# Patient Record
Sex: Male | Born: 1945 | Race: White | Marital: Married | Smoking: Current every day smoker
Health system: Southern US, Community
[De-identification: ages and names within clinical notes are randomized; demographics above are authoritative.]

## PROBLEM LIST (undated history)

## (undated) DIAGNOSIS — E119 Type 2 diabetes mellitus without complications: Secondary | ICD-10-CM

## (undated) DIAGNOSIS — I10 Essential (primary) hypertension: Secondary | ICD-10-CM

## (undated) HISTORY — PX: FOOT SURGERY: SHX648

## (undated) HISTORY — PX: APPENDECTOMY: SHX54

---

## 2017-10-07 ENCOUNTER — Other Ambulatory Visit: Payer: Self-pay

## 2017-10-07 ENCOUNTER — Observation Stay
Admission: EM | Admit: 2017-10-07 | Discharge: 2017-10-08 | Disposition: A | Discharge status: Home Health | Payer: Medicare Other | Attending: Internal Medicine | Admitting: Internal Medicine

## 2017-10-07 ENCOUNTER — Emergency Department: Payer: Medicare Other

## 2017-10-07 ENCOUNTER — Observation Stay: Payer: Medicare Other

## 2017-10-07 ENCOUNTER — Encounter: Payer: Self-pay | Admitting: Emergency Medicine

## 2017-10-07 ENCOUNTER — Observation Stay
Admit: 2017-10-07 | Discharge: 2017-10-07 | Disposition: A | Discharge status: Home / Self Care | Payer: Medicare Other | Attending: Internal Medicine | Admitting: Internal Medicine

## 2017-10-07 DIAGNOSIS — I129 Hypertensive chronic kidney disease with stage 1 through stage 4 chronic kidney disease, or unspecified chronic kidney disease: Secondary | ICD-10-CM | POA: Diagnosis not present

## 2017-10-07 DIAGNOSIS — R2681 Unsteadiness on feet: Secondary | ICD-10-CM | POA: Insufficient documentation

## 2017-10-07 DIAGNOSIS — E785 Hyperlipidemia, unspecified: Secondary | ICD-10-CM | POA: Diagnosis not present

## 2017-10-07 DIAGNOSIS — Z9114 Patient's other noncompliance with medication regimen: Secondary | ICD-10-CM | POA: Diagnosis not present

## 2017-10-07 DIAGNOSIS — E1165 Type 2 diabetes mellitus with hyperglycemia: Secondary | ICD-10-CM | POA: Diagnosis not present

## 2017-10-07 DIAGNOSIS — N19 Unspecified kidney failure: Secondary | ICD-10-CM

## 2017-10-07 DIAGNOSIS — E119 Type 2 diabetes mellitus without complications: Secondary | ICD-10-CM

## 2017-10-07 DIAGNOSIS — F17200 Nicotine dependence, unspecified, uncomplicated: Secondary | ICD-10-CM | POA: Insufficient documentation

## 2017-10-07 DIAGNOSIS — I6501 Occlusion and stenosis of right vertebral artery: Secondary | ICD-10-CM | POA: Insufficient documentation

## 2017-10-07 DIAGNOSIS — E1122 Type 2 diabetes mellitus with diabetic chronic kidney disease: Secondary | ICD-10-CM | POA: Insufficient documentation

## 2017-10-07 DIAGNOSIS — I639 Cerebral infarction, unspecified: Secondary | ICD-10-CM | POA: Diagnosis not present

## 2017-10-07 DIAGNOSIS — R739 Hyperglycemia, unspecified: Secondary | ICD-10-CM

## 2017-10-07 DIAGNOSIS — I10 Essential (primary) hypertension: Secondary | ICD-10-CM

## 2017-10-07 DIAGNOSIS — R4182 Altered mental status, unspecified: Secondary | ICD-10-CM | POA: Diagnosis present

## 2017-10-07 DIAGNOSIS — N183 Chronic kidney disease, stage 3 unspecified: Secondary | ICD-10-CM

## 2017-10-07 HISTORY — DX: Essential (primary) hypertension: I10

## 2017-10-07 HISTORY — DX: Type 2 diabetes mellitus without complications: E11.9

## 2017-10-07 LAB — COMPREHENSIVE METABOLIC PANEL
ALT: 13 U/L — ABNORMAL LOW (ref 17–63)
ANION GAP: 11 (ref 5–15)
AST: 14 U/L — ABNORMAL LOW (ref 15–41)
Albumin: 4 g/dL (ref 3.5–5.0)
Alkaline Phosphatase: 60 U/L (ref 38–126)
BUN: 37 mg/dL — ABNORMAL HIGH (ref 6–20)
CO2: 24 mmol/L (ref 22–32)
Calcium: 9.4 mg/dL (ref 8.9–10.3)
Chloride: 101 mmol/L (ref 101–111)
Creatinine, Ser: 1.42 mg/dL — ABNORMAL HIGH (ref 0.61–1.24)
GFR calc Af Amer: 56 mL/min — ABNORMAL LOW (ref >60)
GFR, EST NON AFRICAN AMERICAN: 49 mL/min — AB (ref >60)
Glucose, Bld: 305 mg/dL — ABNORMAL HIGH (ref 65–99)
POTASSIUM: 4.4 mmol/L (ref 3.5–5.1)
Sodium: 136 mmol/L (ref 135–145)
TOTAL PROTEIN: 7.5 g/dL (ref 6.5–8.1)
Total Bilirubin: 1.2 mg/dL (ref 0.3–1.2)

## 2017-10-07 LAB — URINALYSIS, COMPLETE (UACMP) WITH MICROSCOPIC
BILIRUBIN URINE: NEGATIVE
Bacteria, UA: NONE SEEN
GLUCOSE, UA: 150 mg/dL — AB
Ketones, ur: NEGATIVE mg/dL
LEUKOCYTES UA: NEGATIVE
Nitrite: NEGATIVE
PH: 5 (ref 5.0–8.0)
Protein, ur: 100 mg/dL — AB
SPECIFIC GRAVITY, URINE: 1.009 (ref 1.005–1.030)

## 2017-10-07 LAB — GLUCOSE, CAPILLARY
GLUCOSE-CAPILLARY: 298 mg/dL — AB (ref 65–99)
Glucose-Capillary: 316 mg/dL — ABNORMAL HIGH (ref 65–99)
Glucose-Capillary: 173 mg/dL — ABNORMAL HIGH (ref 65–99)

## 2017-10-07 LAB — ECHOCARDIOGRAM COMPLETE
HEIGHTINCHES: 73 in
WEIGHTICAEL: 2880 [oz_av]

## 2017-10-07 LAB — CBC WITH DIFFERENTIAL/PLATELET
BASOS ABS: 0.1 10*3/uL (ref 0–0.1)
Basophils Relative: 1 %
EOS PCT: 3 %
Eosinophils Absolute: 0.3 10*3/uL (ref 0–0.7)
HCT: 46.7 % (ref 40.0–52.0)
Hemoglobin: 15.7 g/dL (ref 13.0–18.0)
LYMPHS PCT: 26 %
Lymphs Abs: 2.4 10*3/uL (ref 1.0–3.6)
MCH: 30.2 pg (ref 26.0–34.0)
MCHC: 33.6 g/dL (ref 32.0–36.0)
MCV: 89.8 fL (ref 80.0–100.0)
Monocytes Absolute: 0.5 10*3/uL (ref 0.2–1.0)
Monocytes Relative: 6 %
Neutro Abs: 5.8 10*3/uL (ref 1.4–6.5)
Neutrophils Relative %: 64 %
PLATELETS: 128 10*3/uL — AB (ref 150–440)
RBC: 5.2 MIL/uL (ref 4.40–5.90)
RDW: 14 % (ref 11.5–14.5)
WBC: 9 10*3/uL (ref 3.8–10.6)

## 2017-10-07 LAB — URINE DRUG SCREEN, QUALITATIVE (ARMC ONLY)
Amphetamines, Ur Screen: NOT DETECTED
BARBITURATES, UR SCREEN: NOT DETECTED
Benzodiazepine, Ur Scrn: NOT DETECTED
CANNABINOID 50 NG, UR ~~LOC~~: NOT DETECTED
Cocaine Metabolite,Ur ~~LOC~~: NOT DETECTED
MDMA (Ecstasy)Ur Screen: NOT DETECTED
Methadone Scn, Ur: NOT DETECTED
OPIATE, UR SCREEN: NOT DETECTED
PHENCYCLIDINE (PCP) UR S: NOT DETECTED
Tricyclic, Ur Screen: NOT DETECTED

## 2017-10-07 LAB — LIPID PANEL
Cholesterol: 228 mg/dL — ABNORMAL HIGH (ref 0–200)
HDL: 33 mg/dL — ABNORMAL LOW (ref >40)
LDL CALC: 145 mg/dL — AB (ref 0–99)
Total CHOL/HDL Ratio: 6.9 RATIO
Triglycerides: 248 mg/dL — ABNORMAL HIGH (ref <150)
VLDL: 50 mg/dL — ABNORMAL HIGH (ref 0–40)

## 2017-10-07 LAB — TROPONIN I: TROPONIN I: 0.03 ng/mL — AB (ref <0.03)

## 2017-10-07 MED ORDER — CLOPIDOGREL BISULFATE 75 MG PO TABS
75.0000 mg | ORAL_TABLET | Freq: Every day | ORAL | Status: DC
  Administered 2017-10-07 – 2017-10-08 (×2): 75 mg via ORAL
  Filled 2017-10-07 (×2): qty 1

## 2017-10-07 MED ORDER — METFORMIN HCL 500 MG PO TABS
500.0000 mg | ORAL_TABLET | Freq: Two times a day (BID) | ORAL | Status: DC
  Administered 2017-10-07: 18:00:00 500 mg via ORAL
  Filled 2017-10-07: qty 1

## 2017-10-07 MED ORDER — SODIUM CHLORIDE 0.9 % IV SOLN
INTRAVENOUS | Status: DC
  Administered 2017-10-07 – 2017-10-08 (×2): via INTRAVENOUS

## 2017-10-07 MED ORDER — IPRATROPIUM-ALBUTEROL 0.5-2.5 (3) MG/3ML IN SOLN
3.0000 mL | Freq: Four times a day (QID) | RESPIRATORY_TRACT | Status: DC
Start: 2017-10-07 — End: 2017-10-08
  Administered 2017-10-07: 16:00:00 3 mL via RESPIRATORY_TRACT
  Filled 2017-10-07 (×3): qty 3

## 2017-10-07 MED ORDER — AMLODIPINE BESYLATE 5 MG PO TABS
5.0000 mg | ORAL_TABLET | Freq: Every day | ORAL | Status: DC
  Administered 2017-10-07 – 2017-10-08 (×2): 5 mg via ORAL
  Filled 2017-10-07 (×2): qty 1

## 2017-10-07 MED ORDER — DOCUSATE SODIUM 100 MG PO CAPS
100.0000 mg | ORAL_CAPSULE | Freq: Two times a day (BID) | ORAL | Status: DC
  Filled 2017-10-07 (×2): qty 1

## 2017-10-07 MED ORDER — INSULIN GLARGINE 100 UNIT/ML ~~LOC~~ SOLN
10.0000 [IU] | Freq: Every day | SUBCUTANEOUS | Status: DC
  Administered 2017-10-08: 10 [IU] via SUBCUTANEOUS
  Filled 2017-10-07 (×3): qty 0.1

## 2017-10-07 MED ORDER — ACETAMINOPHEN 650 MG RE SUPP: 650.0000 mg | Freq: Four times a day (QID) | RECTAL | Status: DC | PRN

## 2017-10-07 MED ORDER — HEPARIN SODIUM (PORCINE) 5000 UNIT/ML IJ SOLN: 5000.0000 [IU] | Freq: Three times a day (TID) | INTRAMUSCULAR | Status: DC

## 2017-10-07 MED ORDER — LORAZEPAM 2 MG/ML IJ SOLN: 1.0000 mg | Freq: Once | INTRAMUSCULAR | Status: DC

## 2017-10-07 MED ORDER — PANTOPRAZOLE SODIUM 40 MG IV SOLR
40.0000 mg | Freq: Two times a day (BID) | INTRAVENOUS | Status: DC
  Filled 2017-10-07 (×3): qty 40

## 2017-10-07 MED ORDER — LORAZEPAM 2 MG/ML IJ SOLN
0.5000 mg | INTRAMUSCULAR | Status: DC | PRN
  Administered 2017-10-07: 0.5 mg via INTRAVENOUS
  Filled 2017-10-07: qty 1

## 2017-10-07 MED ORDER — ACETAMINOPHEN 325 MG PO TABS: 650.0000 mg | ORAL_TABLET | Freq: Four times a day (QID) | ORAL | Status: DC | PRN

## 2017-10-07 MED ORDER — ATORVASTATIN CALCIUM 20 MG PO TABS
10.0000 mg | ORAL_TABLET | Freq: Every day | ORAL | Status: DC
  Administered 2017-10-07: 10 mg via ORAL
  Filled 2017-10-07: qty 1

## 2017-10-07 MED ORDER — NICOTINE 21 MG/24HR TD PT24
21.0000 mg | MEDICATED_PATCH | Freq: Once | TRANSDERMAL | Status: AC
  Administered 2017-10-07: 21 mg via TRANSDERMAL
  Filled 2017-10-07: qty 1

## 2017-10-07 MED ORDER — BISACODYL 10 MG RE SUPP: 10.0000 mg | Freq: Every day | RECTAL | Status: DC | PRN

## 2017-10-07 MED ORDER — INSULIN ASPART 100 UNIT/ML ~~LOC~~ SOLN
0.0000 [IU] | Freq: Three times a day (TID) | SUBCUTANEOUS | Status: DC
  Administered 2017-10-07: 18:00:00 7 [IU] via SUBCUTANEOUS
  Administered 2017-10-08: 10:00:00 5 [IU] via SUBCUTANEOUS
  Filled 2017-10-07 (×2): qty 1

## 2017-10-07 MED ORDER — ONDANSETRON HCL 4 MG/2ML IJ SOLN: 4.0000 mg | Freq: Four times a day (QID) | INTRAMUSCULAR | Status: DC | PRN

## 2017-10-07 MED ORDER — ONDANSETRON HCL 4 MG PO TABS: 4.0000 mg | ORAL_TABLET | Freq: Four times a day (QID) | ORAL | Status: DC | PRN

## 2017-10-07 NOTE — ED Notes (Signed)
Pt aggitated and wanting to leave, pulled off monitor leads and bp cuff. Pt trying to pull out iv. Pt medicated with ativan. Wife at bedside and sw Claudine in with pt.

## 2017-10-07 NOTE — Plan of Care (Signed)
  Education: Knowledge of disease or condition will improve 10/07/2017 1840 - Progressing by Donnel SaxonKennedy, Lisbeth Puller L, RN Knowledge of secondary prevention will improve 10/07/2017 1840 - Progressing by Donnel SaxonKennedy, Arley Salamone L, RN Knowledge of patient specific risk factors addressed and post discharge goals established will improve 10/07/2017 1840 - Progressing by Donnel SaxonKennedy, Arelene Moroni L, RN   Coping: Will identify appropriate support needs 10/07/2017 1840 - Progressing by Donnel SaxonKennedy, Breven Guidroz L, RN   Health Behavior/Discharge Planning: Ability to manage health-related needs will improve 10/07/2017 1840 - Progressing by Donnel SaxonKennedy, Lilyrose Tanney L, RN   Self-Care: Ability to participate in self-care as condition permits will improve 10/07/2017 1840 - Progressing by Donnel SaxonKennedy, Herchel Hopkin L, RN Ability to communicate needs accurately will improve 10/07/2017 1840 - Progressing by Donnel SaxonKennedy, Wilmot Quevedo L, RN   Ischemic Stroke/TIA Tissue Perfusion: Complications of ischemic stroke/TIA will be minimized 10/07/2017 1840 - Progressing by Donnel SaxonKennedy, Timoth Schara L, RN   Education: Knowledge of General Education information will improve 10/07/2017 1840 - Progressing by Donnel SaxonKennedy, Ashaunti Treptow L, RN   Health Behavior/Discharge Planning: Ability to manage health-related needs will improve 10/07/2017 1840 - Progressing by Donnel SaxonKennedy, Taleah Bellantoni L, RN   Clinical Measurements: Ability to maintain clinical measurements within normal limits will improve 10/07/2017 1840 - Progressing by Donnel SaxonKennedy, Jayne Peckenpaugh L, RN Will remain free from infection 10/07/2017 1840 - Progressing by Donnel SaxonKennedy, Marti Mclane L, RN Diagnostic test results will improve 10/07/2017 1840 - Progressing by Donnel SaxonKennedy, Kayleeann Huxford L, RN Respiratory complications will improve 10/07/2017 1840 - Progressing by Donnel SaxonKennedy, Alani Lacivita L, RN Cardiovascular complication will be avoided 10/07/2017 1840 - Progressing by Donnel SaxonKennedy, Mohogany Toppins L, RN   Activity: Risk for activity intolerance will decrease 10/07/2017  1840 - Progressing by Donnel SaxonKennedy, Bradlee Heitman L, RN   Nutrition: Adequate nutrition will be maintained 10/07/2017 1840 - Progressing by Donnel SaxonKennedy, Edwar Coe L, RN   Coping: Level of anxiety will decrease 10/07/2017 1840 - Progressing by Donnel SaxonKennedy, Reighlyn Elmes L, RN   Coping: Level of anxiety will decrease 10/07/2017 1840 - Progressing by Donnel SaxonKennedy, Janye Maynor L, RN   Elimination: Will not experience complications related to bowel motility 10/07/2017 1840 - Progressing by Donnel SaxonKennedy, Parag Dorton L, RN Will not experience complications related to urinary retention 10/07/2017 1840 - Progressing by Donnel SaxonKennedy, Jone Panebianco L, RN   Safety: Ability to remain free from injury will improve 10/07/2017 1840 - Progressing by Donnel SaxonKennedy, Nnamdi Dacus L, RN   Skin Integrity: Risk for impaired skin integrity will decrease 10/07/2017 1840 - Progressing by Donnel SaxonKennedy, Kathrynne Kulinski L, RN   Skin Integrity: Risk for impaired skin integrity will decrease 10/07/2017 1840 - Progressing by Donnel SaxonKennedy, Da Michelle L, RN

## 2017-10-07 NOTE — ED Triage Notes (Signed)
Here for elevated blood sugars max of 333 over last week. Has not taken his DM or HTN meds in about 3 years per wife because doesn't like doctor. Pt unable to answer birth date, location, time. Pt will sometimes just state "I don't know" and sometimes just look at you. Wife reports this has been for 1 week.

## 2017-10-07 NOTE — ED Notes (Addendum)
Date and time results received: 10/07/17 1009  Test: Troponin Critical Value: 0.03  Name of Provider Notified: Dr. Shaune PollackLord  Orders Received? Or Actions Taken?: No new orders.  Will continue to monitor.

## 2017-10-07 NOTE — Clinical Social Work Note (Signed)
Clinical Social Work Assessment  Patient Details  Name: Joseph RipaJohn Little MRN: [id number] Date of Birth: 1946-02-20  Date of referral:  10/07/17               Reason for consult:  Family Concerns, Discharge Planning                Permission sought to share information with:  Family Supports Permission granted to share information::  Yes, Verbal Permission Granted  Name::     Joseph Little ( wife) [phone] Sister Joseph Little  Agency::     Relationship::     Contact Information:     Housing/Transportation Living arrangements for the past 2 months:  Single Family Home Source of Information:  Patient, Siblings, Spouse Patient Interpreter Needed:  None Criminal Activity/Legal Involvement Pertinent to Current Situation/Hospitalization:  No - Comment as needed Significant Relationships:  Adult Children, Siblings, Spouse Lives with:  Spouse Do you feel safe going back to the place where you live?  Yes Need for family participation in patient care:  Yes (Comment)  Care giving concerns: He is not himself   Office managerocial Worker assessment / plan: LCSW introduced myself to patient his spouse  Joseph Little and his elder sister Velma SmithJohn Lauris Little is a 72 y.o. male has a past medical history significant for HTN and DM and tobacco use with no MD now with acute confusion. In ER, CT positive for CVA. Sugar and BP elevated.   Verbal consent was provided to speak in front of family and patient agreed his wife and sister could help answer questions. Patient and wife have been married 49 years and have 3 grown children. He is a retired Naval architecttruck driver and at home up until a week ago was very independent with all his activities of daily living. He is able to answer simple questions but his speech is impeded according to his wife. 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 He is diabetic. He is able to walk on his own but has been weak last few days and according to wife and sister he is different (  altered mental status and agitated). LCSW reviewed family self care- rest and support for his immediate care givers. In discussion wife reports once patient discharged, he is to return home and that they do not want him to go to SNF. They will remain open depending on Doctors recommendation and will seek out medical floor SW if their needs have changed. Patient has excellent support in home and wife feels that he would do better in his own home. 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  Emotional Assessment Appearance:  Appears stated age Attitude/Demeanor/Rapport:  Guarded, Apprehensive Affect (typically observed):  Appropriate, Calm Orientation:  Oriented to Self, Fluctuating Orientation (Suspected and/or reported Sundowners) Alcohol / Substance use:  Tobacco Use Psych involvement (Current and /or in the community):  No (Comment)  Discharge Needs  Concerns to be addressed:  No discharge needs identified Readmission within the last 30 days:  No Current discharge risk:  None Barriers to  Discharge:  No Barriers Identified   Cheron Schaumann, LCSW 10/07/2017, 1:36 PM

## 2017-10-07 NOTE — ED Provider Notes (Signed)
[facility] Emergency Department Provider Note ____________________________________________   I have reviewed the triage vital signs and the triage nursing note.  HISTORY  Chief Complaint Altered Mental Status   Historian Level 5 Caveat History Limited by patient poor story History per sister as well as wife who lives with him  HPI Joseph Little is a 72 y.o. male brought in by wife and sister for altered mental status worse over the last 2-4 days.  They state that he does not seem to be responding to them very well.  It seems like he takes a well to understand what they are saying, and have trouble getting his words out.  He has not seen a primary doctor in several years for which she was previously treated for diabetes and hypertension, is not on medication for that.  Sound like in the past he was probably also on aspirin.  Patient denies any headache.  Denies any focal weakness or numbness.  He does state that it seems that he has trouble finding his words.  Symptoms are moderate.  They actually presented to [facility] clinic due to elevated blood sugars and confusion and he was sent to the ED for further evaluation.  Denies history of known CVA, TIA, or heart attack.  It sounds like at times he had some episodes which were short-lived over even the past several months, but nothing like the last 2-4 days.   Past Medical History:  Diagnosis Date  . Diabetes mellitus without complication (HCC)   . Hypertension     There are no active problems to display for this patient.   Past Surgical History:  Procedure Laterality Date  . APPENDECTOMY    . FOOT SURGERY      Prior to Admission medications   Not on File    No Known Allergies  History reviewed. No pertinent family history.  Social History Social History   Tobacco Use  . Smoking status: Current Every Day Smoker  . Smokeless tobacco: Never Used  Substance Use Topics  . Alcohol use: No     Frequency: Never  . Drug use: No    Review of Systems  Constitutional: Negative for recent illness. Eyes: Negative for visual changes. ENT: Negative for sore throat. Cardiovascular: Negative for chest pain. Respiratory: Negative for shortness of breath. Gastrointestinal: Negative for abdominal pain, vomiting and diarrhea. Genitourinary: Negative for dysuria. Musculoskeletal: Negative for back pain. Skin: Negative for rash. Neurological: Negative for headache.  ____________________________________________   PHYSICAL EXAM:  VITAL SIGNS: ED Triage Vitals [10/07/17 0924]  Enc Vitals Group     BP      Pulse      Resp      Temp      Temp src      SpO2      Weight 180 lb (81.6 kg)     Height 6\' 1"  (1.854 m)     Head Circumference      Peak Flow      Pain Score      Pain Loc      Pain Edu?      Excl. in GC?      Constitutional: Alert and cooperative, poor historian. Well appearing and in no distress. HEENT   Head: Normocephalic and atraumatic.      Eyes: Conjunctivae are normal. Pupils equal and round.       Ears:         Nose: No congestion/rhinnorhea.   Mouth/Throat: Mucous membranes are moist.  Neck: No stridor. Cardiovascular/Chest: Normal rate, regular rhythm.  No murmurs, rubs, or gallops. Respiratory: Normal respiratory effort without tachypnea nor retractions. Breath sounds are clear and equal bilaterally. No wheezes/rales/rhonchi. Gastrointestinal: Soft. No distention, no guarding, no rebound. Nontender.    Genitourinary/rectal:Deferred Musculoskeletal: Nontender with normal range of motion in all extremities. No joint effusions.  No lower extremity tenderness.  No edema. Neurologic: No facial droop.  No slurred speech, but slow to answer questions. No gross or focal weakness or numbness in the arms of the legs or the face. Skin:  Skin is warm, dry and intact. No rash noted. Psychiatric: No  agitation.   ____________________________________________  LABS (pertinent positives/negatives) I, Governor Rooksebecca Salim Forero, MD the attending physician have reviewed the labs noted below.  Labs Reviewed  GLUCOSE, CAPILLARY - Abnormal; Notable for the following components:      Result Value   Glucose-Capillary 298 (*)    All other components within normal limits  URINALYSIS, COMPLETE (UACMP) WITH MICROSCOPIC - Abnormal; Notable for the following components:   Color, Urine YELLOW (*)    APPearance CLEAR (*)    Glucose, UA 150 (*)    Hgb urine dipstick MODERATE (*)    Protein, ur 100 (*)    Squamous Epithelial / LPF 0-5 (*)    All other components within normal limits  COMPREHENSIVE METABOLIC PANEL - Abnormal; Notable for the following components:   Glucose, Bld 305 (*)    BUN 37 (*)    Creatinine, Ser 1.42 (*)    AST 14 (*)    ALT 13 (*)    GFR calc non Af Amer 49 (*)    GFR calc Af Amer 56 (*)    All other components within normal limits  CBC WITH DIFFERENTIAL/PLATELET - Abnormal; Notable for the following components:   Platelets 128 (*)    All other components within normal limits  TROPONIN I - Abnormal; Notable for the following components:   Troponin I 0.03 (*)    All other components within normal limits  URINE DRUG SCREEN, QUALITATIVE ([facility] ONLY)    ____________________________________________    EKG I, Governor Rooksebecca Cable Fearn, MD, the attending physician have personally viewed and interpreted all ECGs.  93 bpm.  normal sinus rhythm.  Narrow QRS.  Nonspecific ST and T wave.  Occasional PVC ____________________________________________  RADIOLOGY   TM without contrast radiologist report, also discussed with the radiologist:IMPRESSION: 1. Scattered acute versus subacute appearing infarcts in the left hemisphere, largest at the left posterior MCA/PCA watershed area. There is an associated age indeterminate lacune in the left thalamus. Possible petechial hemorrhage, but no  malignant hemorrhagic transformation or mass effect. 2. 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 Tiny chronic left cerebellar lacune. Small chronic appearing lacunar infarcts in the left basal ganglia. __________________________________________  PROCEDURES  Procedure(s) performed: None  Critical Care performed: None   ____________________________________________  ED COURSE / ASSESSMENT AND PLAN  Pertinent labs & imaging results that were available during my care of the patient were reviewed by me and considered in my medical decision making (see chart for details).    Patient does seem somewhat slow to answer questions and they state this is definitely unusual for him given duration of episode and much worse than previously especially over the past 2 to possibly even out to 4 days.  I was called with a CT result from the radiologist indicating multiple acute to subacute strokes with one with a punctate hemorrhagic conversion without any mass-effect or anything.  Also on laboratory studies patient found to have acute renal failure although chronicity is unclear given that he does not have a prior history in our system.  He is also hyperglycemic, doubt DKA.  Spoke with hospitalist for admission.    DIFFERENTIAL DIAGNOSIS: Differential diagnosis includes, but is not limited to, alcohol, illicit or prescription medications, or other toxic ingestion; intracranial pathology such as stroke or intracerebral hemorrhage; fever or infectious causes including sepsis; hypoxemia and/or hypercarbia; uremia; trauma; endocrine related disorders such as diabetes, hypoglycemia, and thyroid-related diseases; hypertensive encephalopathy; etc.   CONSULTATIONS:  Hospitalist for admission.   Patient / Family / Caregiver informed of clinical course, medical decision-making process, and agree with plan.   ___________________________________________   FINAL CLINICAL  IMPRESSION(S) / ED DIAGNOSES   Final diagnoses:  Hyperglycemia  Renal failure, unspecified chronicity  Cerebrovascular accident (CVA), unspecified mechanism (HCC)      ___________________________________________        Note: This dictation was prepared with Dragon dictation. Any transcriptional errors that result from this process are unintentional    Governor Rooks, MD 10/07/17 1122

## 2017-10-07 NOTE — Progress Notes (Signed)
*  PRELIMINARY RESULTS* Echocardiogram 2D Echocardiogram has been performed.  Joseph Little 10/07/2017, 3:53 PM

## 2017-10-07 NOTE — H&P (Signed)
History and Physical    Joseph Little ZOX:[id number] DOB: 08-Sep-1945 DOA: 10/07/2017  Referring physician: Dr. Shaune Pollack PCP: Patient, No Pcp Per  Specialists: none  Chief Complaint: confusion  HPI: Joseph Little is a 72 y.o. male has a past medical history significant for HTN and DM and tobacco use with no MD now with acute confusion. In ER, CT positive for CVA. Sugar and BP elevated. He is now admitted. No fever. Denies CP or SOB. No change in bowels or bladder  Review of Systems: The patient denies anorexia, fever, weight loss,, vision loss, decreased hearing, hoarseness, chest pain, syncope, dyspnea on exertion, peripheral edema, balance deficits, hemoptysis, abdominal pain, melena, hematochezia, severe indigestion/heartburn, hematuria, incontinence, genital sores, muscle weakness, suspicious skin lesions, transient blindness, difficulty walking, depression, unusual weight change, abnormal bleeding, enlarged lymph nodes, angioedema, and breast masses.   Past Medical History:  Diagnosis Date  . Diabetes mellitus without complication (HCC)   . Hypertension    Past Surgical History:  Procedure Laterality Date  . APPENDECTOMY    . FOOT SURGERY     Social History:  reports that he has been smoking.  he has never used smokeless tobacco. He reports that he does not drink alcohol or use drugs.  No Known Allergies  History reviewed. No pertinent family history.  Prior to Admission medications   Not on File   Physical Exam: Vitals:   10/07/17 0924 10/07/17 0930 10/07/17 1000 10/07/17 1100  BP:  (!) 212/66 (!) 154/77 (!) 172/70  Pulse:  (!) 103 (!) 41 72  Resp:  20 14 17   Temp:  98.3 F (36.8 C)    TempSrc:  Oral    SpO2:  97% 95% 96%  Weight: 81.6 kg (180 lb)     Height: 6\' 1"  (1.854 m)        General:  No apparent distress, WDWN, [place]/AT  Eyes: PERRL, EOMI, no scleral icterus, conjunctiva clear  ENT: moist oropharynx without exudate, TM's benign, dentition fair  Neck: supple,  no lymphadenopathy. No bruits or thyromegaly  Cardiovascular: regular rate without MRG; 2+ peripheral pulses, no JVD, no peripheral edema  Respiratory: CTA biL, good air movement without wheezing, rhonchi or crackled. Respiratory effort normal  Abdomen: soft, non tender to palpation, positive bowel sounds, no guarding, no rebound  Skin: no rashes or lesions  Musculoskeletal: normal bulk and tone, no joint swelling  Psychiatric: normal mood and affect, A&OX3  Neurologic: CN 2-12 grossly intact, Motor strength 5/5 in all 4 groups with symmetric DTR's and non-focal sensory exam  Labs on Admission:  Basic Metabolic Panel: Recent Labs  Lab 10/07/17 0931  NA 136  K 4.4  CL 101  CO2 24  GLUCOSE 305*  BUN 37*  CREATININE 1.42*  CALCIUM 9.4   Liver Function Tests: Recent Labs  Lab 10/07/17 0931  AST 14*  ALT 13*  ALKPHOS 60  BILITOT 1.2  PROT 7.5  ALBUMIN 4.0   No results for input(s): LIPASE, AMYLASE in the last 168 hours. No results for input(s): AMMONIA in the last 168 hours. CBC: Recent Labs  Lab 10/07/17 0931  WBC 9.0  NEUTROABS 5.8  HGB 15.7  HCT 46.7  MCV 89.8  PLT 128*   Cardiac Enzymes: Recent Labs  Lab 10/07/17 0931  TROPONINI 0.03*    BNP (last 3 results) No results for input(s): BNP in the last 8760 hours.  ProBNP (last 3 results) No results for input(s): PROBNP in the last 8760 hours.  CBG: Recent  Labs  Lab 10/07/17 0928  GLUCAP 298*    Radiological Exams on Admission: Ct Head Wo Contrast  Addendum Date: 10/07/2017   ADDENDUM REPORT: 10/07/2017 10:42 ADDENDUM: Study discussed by telephone with Dr. Lurena JoinerEBECCA LORD on 10/07/2017 at 1034 hours. She advises that symptom onset was approximately 4 days ago, which seems concordant with the appearance of the left hemisphere infarcts. Electronically Signed   By: Odessa FlemingH  Hall M.D.   On: 10/07/2017 10:42   Result Date: 10/07/2017 CLINICAL DATA:  72 year old male with unexplained altered mental status. EXAM:  CT HEAD WITHOUT CONTRAST TECHNIQUE: Contiguous axial images were obtained from the base of the skull through the vertex without intravenous contrast. COMPARISON:  None. FINDINGS: Brain: Confluent 4 centimeter area of cytotoxic edema in the inferior left parietal and superior left occipital lobes. Possible petechial hemorrhage (series 2, image 15), but no associated mass effect. The left occipital horn is actually slightly larger than the right. There is also a small area of cortical hypodensity in the superior left parietal lobe on series 2, image 22. There is also a small area of cortical hypodensity in the left frontal operculum on image 18. 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 Small age indeterminate lacune in the anterior left thalamus (series 2, image 12). Patchy chronic appearing hypodensity in the right cerebellar hemisphere. The right aspect of the 4th ventricle is slightly larger also suggesting chronic time course. There is also a tiny chronic appearing lacune in the left superior cerebellum on image 9. Scattered bilateral cerebral white matter hypodensity, mostly subcortical. No other cortical based infarct identified. No other intracranial hemorrhage. No intracranial mass effect. No ventriculomegaly. No extra-axial fluid collection. Vascular: Calcified atherosclerosis at the skull base. No definite suspicious intracranial vascular hyperdensity. Skull: Negative. Sinuses/Orbits: Scattered paranasal sinus mucosal thickening and opacification. The tympanic cavities and mastoids appear clear. Other: No acute orbit or scalp soft tissue findings. IMPRESSION: 1. Scattered acute versus subacute appearing infarcts in the left hemisphere, largest at the left posterior MCA/PCA watershed area. There is an associated age indeterminate lacune in the left thalamus. Possible petechial hemorrhage, but no malignant hemorrhagic transformation or mass effect. 2. Age indeterminate but probably chronic  right cerebellar infarct. Tiny chronic left cerebellar lacune. Small chronic appearing lacunar infarcts in the left basal ganglia. Electronically Signed: By: Odessa FlemingH  Hall M.D. On: 10/07/2017 10:32    EKG: Independently reviewed.  Assessment/Plan Principal Problem:   CVA (cerebrovascular accident) (HCC) Active Problems:   HTN (hypertension)   CKD (chronic kidney disease) stage 3, GFR 30-59 ml/min (HCC)   DM (diabetes mellitus) (HCC)   Will observe on floor with telemetry. Consult Neurology. Echo and carotids ordered. Begin statin, metformin, and Plavix. Labs in AM  Diet: soft Fluids: NS@75  DVT Prophylaxis: SQ Heparin  Code Status: FULL  Family Communication: yes  Disposition Plan: home  Time spent: 50 min

## 2017-10-07 NOTE — ED Notes (Signed)
Patient transported to CT 

## 2017-10-08 ENCOUNTER — Observation Stay: Payer: Medicare Other

## 2017-10-08 DIAGNOSIS — I639 Cerebral infarction, unspecified: Secondary | ICD-10-CM | POA: Diagnosis not present

## 2017-10-08 LAB — COMPREHENSIVE METABOLIC PANEL
ALT: 11 U/L — AB (ref 17–63)
AST: 15 U/L (ref 15–41)
Albumin: 3.3 g/dL — ABNORMAL LOW (ref 3.5–5.0)
Alkaline Phosphatase: 50 U/L (ref 38–126)
Anion gap: 8 (ref 5–15)
BUN: 32 mg/dL — AB (ref 6–20)
CHLORIDE: 106 mmol/L (ref 101–111)
CO2: 24 mmol/L (ref 22–32)
CREATININE: 1.22 mg/dL (ref 0.61–1.24)
Calcium: 9.1 mg/dL (ref 8.9–10.3)
GFR calc Af Amer: >60 mL/min (ref >60)
GFR, EST NON AFRICAN AMERICAN: 58 mL/min — AB (ref >60)
Glucose, Bld: 223 mg/dL — ABNORMAL HIGH (ref 65–99)
Potassium: 4.2 mmol/L (ref 3.5–5.1)
Sodium: 138 mmol/L (ref 135–145)
TOTAL PROTEIN: 6 g/dL — AB (ref 6.5–8.1)
Total Bilirubin: 1 mg/dL (ref 0.3–1.2)

## 2017-10-08 LAB — CBC
HCT: 40.3 % (ref 40.0–52.0)
Hemoglobin: 13.9 g/dL (ref 13.0–18.0)
MCH: 30.7 pg (ref 26.0–34.0)
MCHC: 34.4 g/dL (ref 32.0–36.0)
MCV: 89.4 fL (ref 80.0–100.0)
PLATELETS: 106 10*3/uL — AB (ref 150–440)
RBC: 4.51 MIL/uL (ref 4.40–5.90)
RDW: 13.9 % (ref 11.5–14.5)
WBC: 8 10*3/uL (ref 3.8–10.6)

## 2017-10-08 LAB — GLUCOSE, CAPILLARY
GLUCOSE-CAPILLARY: 271 mg/dL — AB (ref 65–99)
Glucose-Capillary: 202 mg/dL — ABNORMAL HIGH (ref 65–99)

## 2017-10-08 LAB — HEMOGLOBIN A1C
Hgb A1c MFr Bld: 10.2 % — ABNORMAL HIGH (ref 4.8–5.6)
Mean Plasma Glucose: 246.04 mg/dL

## 2017-10-08 MED ORDER — IPRATROPIUM-ALBUTEROL 0.5-2.5 (3) MG/3ML IN SOLN: 3.0000 mL | RESPIRATORY_TRACT | Status: DC | PRN

## 2017-10-08 MED ORDER — GLIPIZIDE 5 MG PO TABS: 5.0000 mg | ORAL_TABLET | Freq: Two times a day (BID) | ORAL | 11 refills | Status: DC

## 2017-10-08 MED ORDER — ATORVASTATIN CALCIUM 20 MG PO TABS: 40.0000 mg | ORAL_TABLET | Freq: Every day | ORAL | Status: DC

## 2017-10-08 MED ORDER — ASPIRIN EC 325 MG PO TBEC: 325.0000 mg | DELAYED_RELEASE_TABLET | Freq: Every day | ORAL | 3 refills | Status: DC

## 2017-10-08 MED ORDER — NICOTINE 21 MG/24HR TD PT24: 21.0000 mg | MEDICATED_PATCH | Freq: Once | TRANSDERMAL | 0 refills | Status: AC

## 2017-10-08 MED ORDER — ASPIRIN 325 MG PO TABS
325.0000 mg | ORAL_TABLET | Freq: Every day | ORAL | Status: DC
  Filled 2017-10-08: qty 1

## 2017-10-08 MED ORDER — ATORVASTATIN CALCIUM 40 MG PO TABS: 40.0000 mg | ORAL_TABLET | Freq: Every day | ORAL | 0 refills | Status: DC

## 2017-10-08 MED ORDER — METFORMIN HCL 1000 MG PO TABS: 1000.0000 mg | ORAL_TABLET | Freq: Two times a day (BID) | ORAL | 0 refills | Status: AC

## 2017-10-08 MED ORDER — AMLODIPINE BESYLATE 10 MG PO TABS: 10.0000 mg | ORAL_TABLET | Freq: Every day | ORAL | 0 refills | Status: AC

## 2017-10-08 NOTE — Progress Notes (Signed)
Patient's wife refused scheduled medications including Lantus 10 units at bedtime. Wife told this RN not to bring Heparin injection scheduled this morning  patient is on Plavix. Also refused midnight neuro check assessment, indicated not to wake patient up when he's sleeping. Patient and wife were  educated. Printed materials provided for patient and wife especially on the importance of receiving  Heparin SQ. No acute distress noted. Will continue to monitor.

## 2017-10-08 NOTE — Consult Note (Signed)
Referring Physician: Dr. Juliene PinaMody    Chief Complaint: stroke  HPI: Joseph Little is an 72 y.o. male here for stroke.  Past medical history stroke, hypertension, chronic kidney disease, diabetes, smoking, medical noncompliance.  Patient's family is at bedside.  Had an extended visit with family discussing patient's current stroke which appears embolic as well as prior strokes.  Patient is noncompliant with medication and has not has not been managing his diabetes and other vascular risk factors he is a current every day smoker.  Had a long discussion that current stroke needs further evaluation possibly with TEE and/or loop to rule out the possibility of atrial fibrillation, also discussed he has significant atherosclerosis due to untreated vascular risk factors and current everyday smoking and he has prior bilateral stroke burden in the brain.  Highly encouraged him to stay for further workup patient declines he does not want further workup and he does not want to see neurology outpatient.  Past Medical History:  Diagnosis Date  . Diabetes mellitus without complication (HCC)   . Hypertension     Past Surgical History:  Procedure Laterality Date  . APPENDECTOMY    . FOOT SURGERY      History reviewed. No pertinent family history. Social History:  reports that he has been smoking.  he has never used smokeless tobacco. He reports that he does not drink alcohol or use drugs.  Allergies: No Known Allergies  Medications: reviewed  ROS: Declines answering  Physical Examination: Blood pressure (!) 156/63, pulse 82, temperature 97.7 F (36.5 C), temperature source Oral, resp. rate 20, height 6\' 1"  (1.854 m), weight 222 lb 3.6 oz (100.8 kg), SpO2 96 %.  Patient declines examination of any type  Laboratory Studies:  Basic Metabolic Panel: Recent Labs  Lab 10/07/17 0931 10/08/17 0508  NA 136 138  K 4.4 4.2  CL 101 106  CO2 24 24  GLUCOSE 305* 223*  BUN 37* 32*  CREATININE 1.42* 1.22   CALCIUM 9.4 9.1    Liver Function Tests: Recent Labs  Lab 10/07/17 0931 10/08/17 0508  AST 14* 15  ALT 13* 11*  ALKPHOS 60 50  BILITOT 1.2 1.0  PROT 7.5 6.0*  ALBUMIN 4.0 3.3*   No results for input(s): LIPASE, AMYLASE in the last 168 hours. No results for input(s): AMMONIA in the last 168 hours.  CBC: Recent Labs  Lab 10/07/17 0931 10/08/17 0508  WBC 9.0 8.0  NEUTROABS 5.8  --   HGB 15.7 13.9  HCT 46.7 40.3  MCV 89.8 89.4  PLT 128* 106*    Cardiac Enzymes: Recent Labs  Lab 10/07/17 0931  TROPONINI 0.03*    BNP: Invalid input(s): POCBNP  CBG: Recent Labs  Lab 10/07/17 0928 10/07/17 1659 10/07/17 2130 10/08/17 0825 10/08/17 1132  GLUCAP 298* 316* 173* 271* 202*    Microbiology: No results found for this or any previous visit.  Coagulation Studies: No results for input(s): LABPROT, INR in the last 72 hours.  Urinalysis:  Recent Labs  Lab 10/07/17 1040  COLORURINE YELLOW*  LABSPEC 1.009  PHURINE 5.0  GLUCOSEU 150*  HGBUR MODERATE*  BILIRUBINUR NEGATIVE  KETONESUR NEGATIVE  PROTEINUR 100*  NITRITE NEGATIVE  LEUKOCYTESUR NEGATIVE    Lipid Panel:    Component Value Date/Time   CHOL 228 (H) 10/07/2017 0931   TRIG 248 (H) 10/07/2017 0931   HDL 33 (L) 10/07/2017 0931   CHOLHDL 6.9 10/07/2017 0931   VLDL 50 (H) 10/07/2017 0931   LDLCALC 145 (H) 10/07/2017 [id number]  HgbA1C:  Lab Results  Component Value Date   HGBA1C 10.2 (H) 10/07/2017    Urine Drug Screen:      Component Value Date/Time   LABOPIA NONE DETECTED 10/07/2017 1040   COCAINSCRNUR NONE DETECTED 10/07/2017 1040   LABBENZ NONE DETECTED 10/07/2017 1040   AMPHETMU NONE DETECTED 10/07/2017 1040   THCU NONE DETECTED 10/07/2017 1040   LABBARB NONE DETECTED 10/07/2017 1040    Alcohol Level: No results for input(s): ETH in the last 168 hours.   Imaging: Ct Head Wo Contrast  Addendum Date: 10/07/2017   ADDENDUM REPORT: 10/07/2017 10:42 ADDENDUM: Study discussed by  telephone with Dr. Lurena Joiner LORD on 10/07/2017 at 1034 hours. She advises that symptom onset was approximately 4 days ago, which seems concordant with the appearance of the left hemisphere infarcts. Electronically Signed   By: Odessa Fleming M.D.   On: 10/07/2017 10:42   Result Date: 10/07/2017 CLINICAL DATA:  72 year old male with unexplained altered mental status. EXAM: CT HEAD WITHOUT CONTRAST TECHNIQUE: Contiguous axial images were obtained from the base of the skull through the vertex without intravenous contrast. COMPARISON:  None. FINDINGS: Brain: Confluent 4 centimeter area of cytotoxic edema in the inferior left parietal and superior left occipital lobes. Possible petechial hemorrhage (series 2, image 15), but no associated mass effect. The left occipital horn is actually slightly larger than the right. There is also a small area of cortical hypodensity in the superior left parietal lobe on series 2, image 22. There is also a small area of cortical hypodensity in the left frontal operculum on image 18. 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 Small age indeterminate lacune in the anterior left thalamus (series 2, image 12). Patchy chronic appearing hypodensity in the right cerebellar hemisphere. The right aspect of the 4th ventricle is slightly larger also suggesting chronic time course. There is also a tiny chronic appearing lacune in the left superior cerebellum on image 9. Scattered bilateral cerebral white matter hypodensity, mostly subcortical. No other cortical based infarct identified. No other intracranial hemorrhage. No intracranial mass effect. No ventriculomegaly. No extra-axial fluid collection. Vascular: Calcified atherosclerosis at the skull base. No definite suspicious intracranial vascular hyperdensity. Skull: Negative. Sinuses/Orbits: Scattered paranasal sinus mucosal thickening and opacification. The tympanic cavities and mastoids appear clear. Other: No acute orbit or scalp  soft tissue findings. IMPRESSION: 1. Scattered acute versus subacute appearing infarcts in the left hemisphere, largest at the left posterior MCA/PCA watershed area. There is an associated age indeterminate lacune in the left thalamus. Possible petechial hemorrhage, but no malignant hemorrhagic transformation or mass effect. 2. 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 Tiny chronic left cerebellar lacune. Small chronic appearing lacunar infarcts in the left basal ganglia. Electronically Signed: By: Odessa Fleming M.D. On: 10/07/2017 10:32   Mr Maxine Glenn Head Wo Contrast  Addendum Date: 10/08/2017   ADDENDUM REPORT: 10/08/2017 09:59 ADDENDUM: Study discussed by telephone with Dr. Patricia Pesa MODY on 10/08/2017 at 0946 hours. Electronically Signed   By: Odessa Fleming M.D.   On: 10/08/2017 09:59   Result Date: 10/08/2017 CLINICAL DATA:  71 year old male with recent unexplained altered mental status. Scattered acute and subacute appearing cerebral infarcts on head CT yesterday, most pronounced in the left MCA/PCA watershed area. EXAM: MRI HEAD WITHOUT CONTRAST MRA HEAD WITHOUT CONTRAST TECHNIQUE: Multiplanar, multiecho pulse sequences of the brain and surrounding structures were obtained without intravenous contrast. Angiographic images of the head were obtained using MRA technique without contrast. COMPARISON:  Carotid Doppler ultrasound  10/07/2017. Head CT 10/07/2017. FINDINGS: MRI HEAD FINDINGS Brain: Confluent restricted diffusion involving 4.5 centimeter area of the superolateral left occipital lobe and inferior left parietal lobe corresponding to the dominant cytotoxic edema on CT yesterday. Scattered additional small cortical and white matter foci of restricted diffusion, in both the left occipital lobe and the left parietal lobe. Associated cytotoxic edema. Possible trace petechial hemorrhage. No mass effect. Additional abnormal trace diffusion signal in the mid left corona radiata which appears isointense on  ADC (series 100, image 33). No other diffusion abnormality. There are patchy chronic infarcts in the right cerebellum, several small chronic infarcts in the left cerebellum. Small cortical infarcts in the right lateral occipital lobe, a small chronic cortical infarct in the left superior parietal lobe (mild hemosiderin) a chronic lacunar infarct of the left corona radiata and basal ganglia (mild hemosiderin), and small chronic lacunar infarcts in both thalami. Scattered additional bilateral cerebral white matter T2 and FLAIR hyperintensity also likely is small vessel disease related. No midline shift, mass effect, evidence of mass lesion, ventriculomegaly, extra-axial collection or acute intracranial hemorrhage. Cervicomedullary junction and pituitary are within normal limits. Vascular: Loss of the distal right vertebral artery flow void suspicious for occlusion. The distal left vertebral artery flow void is maintained. The other Major intracranial vascular flow voids are preserved. Skull and upper cervical spine: Normal visible cervical spine. Normal bone marrow signal. Sinuses/Orbits: Normal orbits soft tissues. Mild to moderate paranasal sinus mucosal thickening. Other: Visible internal auditory structures appear normal. Mastoid air cells are clear. Scalp and face soft tissues appear negative. MRA HEAD FINDINGS Absent flow in the distal right vertebral artery. The distal left vertebral artery is patent, as is the left PICA although the left PICA origin is not included. There is irregularity of the distal left vertebral artery and basilar artery without stenosis. Patent SCA origins. There are fetal type bilateral PCA origins, although small PCA P1 segments might be chronically thrombosed, uncertain. The left PCA is occluded in the P1 segment. There is severe stenosis in the right PCA P2 segment (series 106, image 15). Antegrade flow in both ICA siphons. Widespread siphon irregularity with no hemodynamically  significant stenosis. There does appear to be mild right siphon anterior genu stenosis. Normal ophthalmic and posterior communicating artery origins. Patent carotid termini. Normal left MCA origin. Mild left MCA M1 segment irregularity. Mild to moderate irregularity and stenosis at the left MCA bifurcation. There is a 6 centimeter segment of thrombosis or severe stenosis of a posterior left M2 branch (series 104, image 5). No other left MCA branch occlusion is identified. There is moderate to severe regularity throughout the right MCA M1 segment with up to moderate stenoses (series 104, image 7). The right MCA bifurcation is patent. There is a paucity of posterior right MCA M3 branches. There is moderate irregularity and stenosis of the left ACA A1 segment, although it may be non dominant. The right A1 is mildly irregular. The anterior communicating artery is patent. The left A2 origin is mildly to moderately irregular and stenotic. The distal ACA branches remain patent. IMPRESSION: 1. Acute to subacute 4.5 cm infarct in the posterior left hemisphere at the Left PCA/MCA watershed corresponding to the dominant CT finding. Edema with questionable petechial hemorrhage but no mass effect. 2. Occlusion of both the Left PCA, and a short segment occlusion of a Left MCA posterior M2 branch. 3. There is a small subacute appearing lacunar infarct in the left corona radiata, left MCA small vessel territory. 4.  Multiple other chronic cerebral infarcts, including in both cerebellar hemispheres, both thalami, the left basal ganglia, and in small areas of posterior left MCA and right PCA territory cortex. 5. Occlusion of the distal Right vertebral artery, appears chronic. Additional intracranial atherosclerosis with moderate or severe stenoses of the: - Right MCA. - Left ACA. - Right PCA. Electronically Signed: By: Odessa Fleming M.D. On: 10/08/2017 09:42   Mr Brain Wo Contrast  Addendum Date: 10/08/2017   ADDENDUM REPORT: 10/08/2017  09:59 ADDENDUM: Study discussed by telephone with Dr. Patricia Pesa MODY on 10/08/2017 at 0946 hours. Electronically Signed   By: Odessa Fleming M.D.   On: 10/08/2017 09:59   Result Date: 10/08/2017 CLINICAL DATA:  72 year old male with recent unexplained altered mental status. Scattered acute and subacute appearing cerebral infarcts on head CT yesterday, most pronounced in the left MCA/PCA watershed area. EXAM: MRI HEAD WITHOUT CONTRAST MRA HEAD WITHOUT CONTRAST TECHNIQUE: Multiplanar, multiecho pulse sequences of the brain and surrounding structures were obtained without intravenous contrast. Angiographic images of the head were obtained using MRA technique without contrast. COMPARISON:  Carotid Doppler ultrasound 10/07/2017. Head CT 10/07/2017. FINDINGS: MRI HEAD FINDINGS Brain: Confluent restricted diffusion involving 4.5 centimeter area of the superolateral left occipital lobe and inferior left parietal lobe corresponding to the dominant cytotoxic edema on CT yesterday. Scattered additional small cortical and white matter foci of restricted diffusion, in both the left occipital lobe and the left parietal lobe. Associated cytotoxic edema. Possible trace petechial hemorrhage. No mass effect. Additional abnormal trace diffusion signal in the mid left corona radiata which appears isointense on ADC (series 100, image 33). No other diffusion abnormality. There are patchy chronic infarcts in the right cerebellum, several small chronic infarcts in the left cerebellum. Small cortical infarcts in the right lateral occipital lobe, a small chronic cortical infarct in the left superior parietal lobe (mild hemosiderin) a chronic lacunar infarct of the left corona radiata and basal ganglia (mild hemosiderin), and small chronic lacunar infarcts in both thalami. Scattered additional bilateral cerebral white matter T2 and FLAIR hyperintensity also likely is small vessel disease related. No midline shift, mass effect, evidence of mass lesion,  ventriculomegaly, extra-axial collection or acute intracranial hemorrhage. Cervicomedullary junction and pituitary are within normal limits. Vascular: Loss of the distal right vertebral artery flow void suspicious for occlusion. The distal left vertebral artery flow void is maintained. The other Major intracranial vascular flow voids are preserved. Skull and upper cervical spine: Normal visible cervical spine. Normal bone marrow signal. Sinuses/Orbits: Normal orbits soft tissues. Mild to moderate paranasal sinus mucosal thickening. Other: Visible internal auditory structures appear normal. Mastoid air cells are clear. Scalp and face soft tissues appear negative. MRA HEAD FINDINGS Absent flow in the distal right vertebral artery. The distal left vertebral artery is patent, as is the left PICA although the left PICA origin is not included. There is irregularity of the distal left vertebral artery and basilar artery without stenosis. Patent SCA origins. There are fetal type bilateral PCA origins, although small PCA P1 segments might be chronically thrombosed, uncertain. The left PCA is occluded in the P1 segment. There is severe stenosis in the right PCA P2 segment (series 106, image 15). Antegrade flow in both ICA siphons. Widespread siphon irregularity with no hemodynamically significant stenosis. There does appear to be mild right siphon anterior genu stenosis. Normal ophthalmic and posterior communicating artery origins. Patent carotid termini. Normal left MCA origin. Mild left MCA M1 segment irregularity. Mild to moderate irregularity and stenosis at  the left MCA bifurcation. There is a 6 centimeter segment of thrombosis or severe stenosis of a posterior left M2 branch (series 104, image 5). No other left MCA branch occlusion is identified. There is moderate to severe regularity throughout the right MCA M1 segment with up to moderate stenoses (series 104, image 7). The right MCA bifurcation is patent. There is a  paucity of posterior right MCA M3 branches. There is moderate irregularity and stenosis of the left ACA A1 segment, although it may be non dominant. The right A1 is mildly irregular. The anterior communicating artery is patent. The left A2 origin is mildly to moderately irregular and stenotic. The distal ACA branches remain patent. IMPRESSION: 1. Acute to subacute 4.5 cm infarct in the posterior left hemisphere at the Left PCA/MCA watershed corresponding to the dominant CT finding. Edema with questionable petechial hemorrhage but no mass effect. 2. Occlusion of both the Left PCA, and a short segment occlusion of a Left MCA posterior M2 branch. 3. There is a small subacute appearing lacunar infarct in the left corona radiata, left MCA small vessel territory. 4. Multiple other chronic cerebral infarcts, including in both cerebellar hemispheres, both thalami, the left basal ganglia, and in small areas of posterior left MCA and right PCA territory cortex. 5. Occlusion of the distal Right vertebral artery, appears chronic. Additional intracranial atherosclerosis with moderate or severe stenoses of the: - Right MCA. - Left ACA. - Right PCA. Electronically Signed: By: Odessa Fleming M.D. On: 10/08/2017 09:42   US Carotid Bilateral  Result Date: 10/07/2017 CLINICAL DATA:  History of CVA. History of hypertension, hyperlipidemia and diabetes. EXAM: BILATERAL CAROTID DUPLEX ULTRASOUND TECHNIQUE: Wallace Cullens scale imaging, color Doppler and duplex ultrasound were performed of bilateral carotid and vertebral arteries in the neck. COMPARISON:  None. FINDINGS: Criteria: Quantification of carotid stenosis is based on velocity parameters that correlate the residual internal carotid diameter with NASCET-based stenosis levels, using the diameter of the distal internal carotid lumen as the denominator for stenosis measurement. The following velocity measurements were obtained: RIGHT ICA:  112/32 cm/sec CCA:  68/12 cm/sec SYSTOLIC ICA/CCA RATIO:   1.7 DIASTOLIC ICA/CCA RATIO:  2.6 ECA:  195 cm/sec LEFT ICA:  82/24 cm/sec CCA:  97/11 cm/sec SYSTOLIC ICA/CCA RATIO:  0.9 DIASTOLIC ICA/CCA RATIO:  2.2 ECA:  127 cm/sec RIGHT CAROTID ARTERY: Scattered minimal amount of mixed echogenic plaque seen throughout the right common carotid artery. There is a moderate to large amount of atherosclerotic plaque within the right carotid bulb (images 14 and 16), extending to involve the origin and proximal aspects of the right internal carotid artery (image 24), not resulting in elevated peak systolic velocities within the interrogated course the right internal carotid artery to suggest a hemodynamically significant stenosis. RIGHT VERTEBRAL ARTERY:  Antegrade flow LEFT CAROTID ARTERY: There is a moderate amount of atherosclerotic plaque involving the mid aspect the left common carotid artery (images 39 and 40). There is a minimal amount of atherosclerotic plaque within the left carotid bulb (images 47 and 49), extending to involve the origin and proximal aspect the left internal carotid artery (image 57), not resulting in elevated peak systolic velocities within the interrogated course the left internal carotid artery to suggest a hemodynamically significant stenosis. LEFT VERTEBRAL ARTERY:  Antegrade flow IMPRESSION: Moderate to large amount of bilateral atherosclerotic plaque, not resulting in a hemodynamically significant stenosis within either internal carotid artery. Electronically Signed   By: Simonne Come M.D.   On: 10/07/2017 15:48   Attempted exam,  patient declines   Assessment: 72 y.o. male  for stroke.  Past medical history stroke, hypertension, chronic kidney disease, diabetes, smoking, medical noncompliance.  Patient's family is at bedside  Stroke likely embolic, widespread atherosclerotic disease, thrombus or severe stenosis left M2 discussed with familyi in detail recommended further workup inpatiemt, patient declines and family is frustrated.    Stroke  Risk Factors -age, smoking, previous strokes, hypertension, chronic kidney disease, diabetes, medical noncompliance, obesity.  Plan: 1. HgbA1c 10.2 uncontrolled, fasting lipid panel LDL 145 uncontrolled recommend discharge on Lipitor 80 mg daily and management of uncontrolled diabetes. 2. MRI, MRA  of the brain without contrast showed left PCA MCA watershed infarct, occlusion of both the left PCA in the left MCA, subacute appearing lacunar infarcts in the left corona radiator and left MCA small vessel territory, multiple other chronic cerebral infarcts bilaterally.Thrombus or severe stenosis left M2 Recommend CTA head and neck, he declines.  3. PT consult, OT consult, Speech consult: I recommend these outpatient 4. Echocardiogram: Pending results.  Recommend TEE patient declines. 5. Carotid dopplers: Moderate to large amount of bilateral atherosclerotic plaque not resulting in hemodynamically significant stenosis within either internal carotid artery.  Recommend CTA of the head and neck, patient declines. 6. Prophylactic therapy-recommend aspirin 81 mg and Plavix 75 mg for 3 weeks then aspirin 325 alone daily.  Discussed compliance with patient and his very high risk of repeat strokes should he not manage his vascular risk factors. Also thrombus in the left m2, he declines any further workup.   I had a long d/w patient about recent stroke, high risk for recurrent stroke/TIAs, personally independently reviewed imaging studies and stroke evaluation results and answered questions. Recommend DUAP therapy for 3 weeks and then daily ASA 325 but he will only take ASA 325.for secondary stroke prevention and maintain strict control of hypertension with blood pressure goal below 130/90, diabetes with hemoglobin A1c goal below 6.5% and lipids with LDL cholesterol goal below 70 mg/dL. I also advised the patient to eat a healthy diet with plenty of whole grains, cereals, fruits and vegetables, exercise regularly and  maintain ideal body weight and stop smoking .Followup in the future with me in 2 months or call earlier if necessary.  Personally examined patient and images, and have participated in and made any corrections needed to history, physical, neuro exam,assessment and plan as stated above.  I have personally obtained the history, evaluated lab date, reviewed imaging studies and agree with radiology interpretations.   Stroke likely embolic, widespread atherosclerotic disease, thrombus or severe stenosis left M2 discussed with familyi in detail recommended further workup inpatient due to high risk of further strokes and significant morbidity and mortality, patient declines and family is frustrated.   Stroke will sign off at this time Naomie Dean, MD

## 2017-10-08 NOTE — Care Management Note (Signed)
Case Management Note  Patient Details  Name: Jamey RipaJohn Courtwright MRN: [id number] Date of Birth: 1946-01-19  Subjective/Objective:    Discussed discharge planning with wife. A referral for HH=PT and Speech was called to [facility].                 Action/Plan:   Expected Discharge Date:  10/08/17               Expected Discharge Plan:  Home w Home Health Services  In-House Referral:     Discharge planning Services  CM Consult  Post Acute Care Choice:    Choice offered to:  Spouse  DME Arranged:    DME Agency:     HH Arranged:  PT, Speech Therapy HH Agency:  [facility]  Status of Service:  Completed, signed off  If discussed at Long Length of Stay Meetings, dates discussed:    Additional Comments:  Latreshia Beauchaine A, RN 10/08/2017, 12:48 PM

## 2017-10-08 NOTE — Discharge Summary (Signed)
[employer] - [place] [facility]   PATIENT NAME: Joseph Little    MR#:  [id number]  DATE OF BIRTH:  1946/05/04  DATE OF ADMISSION:  10/07/2017 ADMITTING PHYSICIAN: Marguarite Arbour, MD  DATE OF DISCHARGE: 10/08/2017  PRIMARY CARE PHYSICIAN: dr Marcelino Duster    ADMISSION DIAGNOSIS:  Hyperglycemia [R73.9] CVA (cerebrovascular accident) [facility]) [I63.9] Cerebrovascular accident (CVA), unspecified mechanism (HCC) [I63.9] Renal failure, unspecified chronicity [N19]  DISCHARGE DIAGNOSIS:  Principal Problem:   CVA (cerebrovascular accident) (HCC) Active Problems:   HTN (hypertension)   CKD (chronic kidney disease) stage 3, GFR 30-59 ml/min (HCC)   DM (diabetes mellitus) (HCC)   SECONDARY DIAGNOSIS:   Past Medical History:  Diagnosis Date  . Diabetes mellitus without complication (HCC)   . Hypertension     HOSPITAL COURSE:  72 year old male with history of diabetes and hypertension who has been noncompliant with medications for 3 years presents with speech difficulty and confusion over the past 4 days.  1.Scattered acute versus subacute appearing infarcts in the left hemisphere, largest at the left posterior MCA/PCA watershed area Patient on aspirin, statin. Patient will need speech therapy. Carotid ultrasound did not show evidence of hemodynamically significant stenosis. MRI shows  Acute to subacute 4.5 cm infarct in the posterior left hemisphere at the Left PCA/MCA watershed corresponding to the dominant CT finding. Edema with questionable petechial hemorrhage but no mass effect. 2. Occlusion of both the Left PCA, and a short segment occlusion of a Left MCA posterior M2 branch. 3. There is a small subacute appearing lacunar infarct in the left corona radiata, left MCA small vessel territory  2. Diabetes: A1c is 10.3. Patient does not want to be on insulin. I did discuss that this may be a better option for him in the short-term. He will be discharged on  glipizide and metformin which apparently patient was on in the past. He will follow-up with PCP regarding this. He will need to establish care with a primary care physician and we are referring him to Dr. Laural Benes at [facility] clinic.   3. Essential hypertension: I suspect the cause of his stroke is due to uncontrolled blood pressure He will be discharged on Norvasc with outpatient follow-up  4. Hyperlipidemia: He is started on statin and will need repeat lipid test in 6 weeks  5.Tobacco dependence: Patient is encouraged to quit smoking. Counseling was provided for 4 minutes.   DISCHARGE CONDITIONS AND DIET:   Stable Cardiac diabetic diet  CONSULTS OBTAINED:  Treatment Team:  Thana Farr, MD  DRUG ALLERGIES:  No Known Allergies  DISCHARGE MEDICATIONS:   Allergies as of 10/08/2017   No Known Allergies     Medication List    TAKE these medications   amLODipine 10 MG tablet Commonly known as:  NORVASC Take 1 tablet (10 mg total) by mouth daily.   aspirin EC 325 MG tablet Take 1 tablet (325 mg total) by mouth daily.   atorvastatin 40 MG tablet Commonly known as:  LIPITOR Take 1 tablet (40 mg total) by mouth daily at 6 PM.   glipiZIDE 5 MG tablet Commonly known as:  GLUCOTROL Take 1 tablet (5 mg total) by mouth 2 (two) times daily.   metFORMIN 1000 MG tablet Commonly known as:  GLUCOPHAGE Take 1 tablet (1,000 mg total) by mouth 2 (two) times daily with a meal.   nicotine 21 mg/24hr patch Commonly known as:  NICODERM CQ - dosed in mg/24 hours Place 1 patch (21 mg total) onto  the skin once for 1 dose.         Today   CHIEF COMPLAINT:   His symptoms started 4 days prior to admission to the hospital. He has some residual speech deficits but no other focal deficits   VITAL SIGNS:  Blood pressure (!) 166/73, pulse (!) 110, temperature 98 F (36.7 C), temperature source Oral, resp. rate 17, height 6\' 1"  (1.854 m), weight 100.8 kg (222 lb 3.6 oz), SpO2 98  %.   REVIEW OF SYSTEMS:  Review of Systems  Constitutional: Negative.  Negative for chills, fever and malaise/fatigue.  HENT: Negative.  Negative for ear discharge, ear pain, hearing loss, nosebleeds and sore throat.   Eyes: Negative.  Negative for blurred vision and pain.  Respiratory: Negative.  Negative for cough, hemoptysis, shortness of breath and wheezing.   Cardiovascular: Negative.  Negative for chest pain, palpitations and leg swelling.  Gastrointestinal: Negative.  Negative for abdominal pain, blood in stool, diarrhea, nausea and vomiting.  Genitourinary: Negative.  Negative for dysuria.  Musculoskeletal: Negative.  Negative for back pain.  Skin: Negative.   Neurological: Positive for speech change. Negative for dizziness, tremors, focal weakness, seizures and headaches.  Endo/Heme/Allergies: Negative.  Does not bruise/bleed easily.  Psychiatric/Behavioral: Negative.  Negative for depression, hallucinations and suicidal ideas.     PHYSICAL EXAMINATION:  GENERAL:  72 y.o.-year-old patient lying in the bed with no acute distress.  NECK:  Supple, no jugular venous distention. No thyroid enlargement, no tenderness.  LUNGS: Normal breath sounds bilaterally, no wheezing, rales,rhonchi  No use of accessory muscles of respiration.  CARDIOVASCULAR: S1, S2 normal. No murmurs, rubs, or gallops.  ABDOMEN: Soft, non-tender, non-distended. Bowel sounds present. No organomegaly or mass.  EXTREMITIES: No pedal edema, cyanosis, or clubbing.  PSYCHIATRIC: The patient is alert and oriented x 3.  SKIN: No obvious rash, lesion, or ulcer.   DATA REVIEW:   CBC Recent Labs  Lab 10/08/17 0508  WBC 8.0  HGB 13.9  HCT 40.3  PLT 106*    Chemistries  Recent Labs  Lab 10/08/17 0508  NA 138  K 4.2  CL 106  CO2 24  GLUCOSE 223*  BUN 32*  CREATININE 1.22  CALCIUM 9.1  AST 15  ALT 11*  ALKPHOS 50  BILITOT 1.0    Cardiac Enzymes Recent Labs  Lab 10/07/17 0931  TROPONINI 0.03*     Microbiology Results  @[email or web address] @  RADIOLOGY:  Ct Head Wo Contrast  Addendum Date: 10/07/2017   ADDENDUM REPORT: 10/07/2017 10:42 ADDENDUM: Study discussed by telephone with Dr. Lurena Joiner LORD on 10/07/2017 at 1034 hours. She advises that symptom onset was approximately 4 days ago, which seems concordant with the appearance of the left hemisphere infarcts. Electronically Signed   By: Odessa Fleming M.D.   On: 10/07/2017 10:42   Result Date: 10/07/2017 CLINICAL DATA:  72 year old male with unexplained altered mental status. EXAM: CT HEAD WITHOUT CONTRAST TECHNIQUE: Contiguous axial images were obtained from the base of the skull through the vertex without intravenous contrast. COMPARISON:  None. FINDINGS: Brain: Confluent 4 centimeter area of cytotoxic edema in the inferior left parietal and superior left occipital lobes. Possible petechial hemorrhage (series 2, image 15), but no associated mass effect. The left occipital horn is actually slightly larger than the right. There is also a small area of cortical hypodensity in the superior left parietal lobe on series 2, image 22. There is also a small area of cortical hypodensity in the left frontal operculum on image 18.  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 Small age indeterminate lacune in the anterior left thalamus (series 2, image 12). Patchy chronic appearing hypodensity in the right cerebellar hemisphere. The right aspect of the 4th ventricle is slightly larger also suggesting chronic time course. There is also a tiny chronic appearing lacune in the left superior cerebellum on image 9. Scattered bilateral cerebral white matter hypodensity, mostly subcortical. No other cortical based infarct identified. No other intracranial hemorrhage. No intracranial mass effect. No ventriculomegaly. No extra-axial fluid collection. Vascular: Calcified atherosclerosis at the skull base. No definite suspicious intracranial vascular hyperdensity. Skull:  Negative. Sinuses/Orbits: Scattered paranasal sinus mucosal thickening and opacification. The tympanic cavities and mastoids appear clear. Other: No acute orbit or scalp soft tissue findings. IMPRESSION: 1. Scattered acute versus subacute appearing infarcts in the left hemisphere, largest at the left posterior MCA/PCA watershed area. There is an associated age indeterminate lacune in the left thalamus. Possible petechial hemorrhage, but no malignant hemorrhagic transformation or mass effect. 2. 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 Tiny chronic left cerebellar lacune. Small chronic appearing lacunar infarcts in the left basal ganglia. Electronically Signed: By: Odessa Fleming M.D. On: 10/07/2017 10:32   US Carotid Bilateral  Result Date: 10/07/2017 CLINICAL DATA:  History of CVA. History of hypertension, hyperlipidemia and diabetes. EXAM: BILATERAL CAROTID DUPLEX ULTRASOUND TECHNIQUE: Wallace Cullens scale imaging, color Doppler and duplex ultrasound were performed of bilateral carotid and vertebral arteries in the neck. COMPARISON:  None. FINDINGS: Criteria: Quantification of carotid stenosis is based on velocity parameters that correlate the residual internal carotid diameter with NASCET-based stenosis levels, using the diameter of the distal internal carotid lumen as the denominator for stenosis measurement. The following velocity measurements were obtained: RIGHT ICA:  112/32 cm/sec CCA:  68/12 cm/sec SYSTOLIC ICA/CCA RATIO:  1.7 DIASTOLIC ICA/CCA RATIO:  2.6 ECA:  195 cm/sec LEFT ICA:  82/24 cm/sec CCA:  97/11 cm/sec SYSTOLIC ICA/CCA RATIO:  0.9 DIASTOLIC ICA/CCA RATIO:  2.2 ECA:  127 cm/sec RIGHT CAROTID ARTERY: Scattered minimal amount of mixed echogenic plaque seen throughout the right common carotid artery. There is a moderate to large amount of atherosclerotic plaque within the right carotid bulb (images 14 and 16), extending to involve the origin and proximal aspects of the right internal  carotid artery (image 24), not resulting in elevated peak systolic velocities within the interrogated course the right internal carotid artery to suggest a hemodynamically significant stenosis. RIGHT VERTEBRAL ARTERY:  Antegrade flow LEFT CAROTID ARTERY: There is a moderate amount of atherosclerotic plaque involving the mid aspect the left common carotid artery (images 39 and 40). There is a minimal amount of atherosclerotic plaque within the left carotid bulb (images 47 and 49), extending to involve the origin and proximal aspect the left internal carotid artery (image 57), not resulting in elevated peak systolic velocities within the interrogated course the left internal carotid artery to suggest a hemodynamically significant stenosis. LEFT VERTEBRAL ARTERY:  Antegrade flow IMPRESSION: Moderate to large amount of bilateral atherosclerotic plaque, not resulting in a hemodynamically significant stenosis within either internal carotid artery. Electronically Signed   By: Simonne Come M.D.   On: 10/07/2017 15:48      Allergies as of 10/08/2017   No Known Allergies     Medication List    TAKE these medications   amLODipine 10 MG tablet Commonly known as:  NORVASC Take 1 tablet (10 mg total) by mouth daily.   aspirin EC 325 MG tablet Take 1 tablet (  325 mg total) by mouth daily.   atorvastatin 40 MG tablet Commonly known as:  LIPITOR Take 1 tablet (40 mg total) by mouth daily at 6 PM.   glipiZIDE 5 MG tablet Commonly known as:  GLUCOTROL Take 1 tablet (5 mg total) by mouth 2 (two) times daily.   metFORMIN 1000 MG tablet Commonly known as:  GLUCOPHAGE Take 1 tablet (1,000 mg total) by mouth 2 (two) times daily with a meal.   nicotine 21 mg/24hr patch Commonly known as:  NICODERM CQ - dosed in mg/24 hours Place 1 patch (21 mg total) onto the skin once for 1 dose.        Management plans discussed with the patient and he is in agreement. Stable for discharge   Patient should follow up  with pcp  CODE STATUS:     Code Status Orders  (From admission, onward)        Start     Ordered   10/07/17 1422  Full code  Continuous     10/07/17 1421    Code Status History    Date Active Date Inactive Code Status Order ID Comments User Context   This patient has a current code status but no historical code status.      TOTAL TIME TAKING CARE OF THIS PATIENT: 38 minutes.    Note: This dictation was prepared with Dragon dictation along with smaller phrase technology. Any transcriptional errors that result from this process are unintentional.  Mirely Pangle M.D on 10/08/2017 at 8:38 AM  Between 7am to 6pm - Pager - [phone] After 6pm go to [email or web address] - password [email or web address]  [facility]  Office  [phone]  CC: Primary care physician; dr Marcelino Dusterjohn johnston

## 2017-10-08 NOTE — Progress Notes (Signed)
Pt is being discharged today, discharge instructions were reviewed with him and his family and he verified understanding. 3 paper prescriptions were given to him, he was rolled out in a wheelchair by staff.

## 2017-10-08 NOTE — Evaluation (Signed)
Physical Therapy Evaluation Patient Details Name: Joseph RipaJohn Suthers MRN: [id number] DOB: 10/12/1945 Today's Date: 10/08/2017   History of Present Illness  Pt is a Pt is a 72 y.o. male with a past medical history significant for HTN and DM and tobacco use with no MD now with acute confusion. In ER, CT positive for CVA. Glucose and BP elevated.  MRI revealed scattered acute and subacute infarcts primarily in the MCA/PCA watershed area.  Pt admitted for CVA, hyperglycemia and renal failure.  Clinical Impression  Pt is a 72 y.o. Male who lives in a one story home with his wife.  Pt was independent prior to admission.  He is in bed upon PT arrival and independent with bed mobility.  Pt presented with a flat affect and answered questions intermittently and then appeared confused at times.  Pt also presented with impulsive behavior, standing quickly from bedside and quickly changing directions during gait.  PT provided close supervision during all dynamic activity.  Pt strength and coordination testing all appeared to be WNL.  Pt reported no sensation loss.  Pt was able to neavigate 6 steps with use of unilateral handrail, using slow step over step pattern to ascend and step to pattern to descend, per PT advice.  Pt is unfamiliar with use of RW and required several VC's for safety.  Pt will continue to benefit from skilled PT for balance, stair training, safe use of RW and gait training.    Follow Up Recommendations Home health PT    Equipment Recommendations       Recommendations for Other Services Speech consult;OT consult     Precautions / Restrictions Precautions Precautions: Fall Restrictions Weight Bearing Restrictions: No      Mobility  Bed Mobility Overal bed mobility: Independent                Transfers Overall transfer level: Needs assistance   Transfers: Sit to/from Stand Sit to Stand: Supervision         General transfer comment: Pt able to initiate and complete transfer  without physical assist but appears unsteady on feet.  Pt is not accustomed to using a RW so requires VC's for safe use.  Ambulation/Gait Ambulation/Gait assistance: Supervision Ambulation Distance (Feet): 200 Feet Assistive device: Rolling walker (2 wheeled)     Gait velocity interpretation: at or above normal speed for age/gender General Gait Details: Pt presents as impulsive with movement and requires VC's for use of RW.  PT noted crossover gait and narrow BOS during ambulation; educated pt and wife concerning correction of gait mechanics for fall prevention.  Stairs Stairs: Yes Stairs assistance: Supervision Stair Management: One rail Right Number of Stairs: 6 General stair comments: Ascended with slow step over step pattern; pt was more comfortable with step to gait pattern to descend and PT advised pt to continue to use this gait pattern for safety.  Wheelchair Mobility    Modified Rankin (Stroke Patients Only)       Balance Overall balance assessment: Modified Independent                                           Pertinent Vitals/Pain Pain Assessment: No/denies pain    Home Living Family/patient expects to be discharged to:: Private residence Living Arrangements: Spouse/significant other Available Help at Discharge: Family Type of Home: House Home Access: Stairs to enter Entrance Stairs-Rails: Can reach  both Entrance Stairs-Number of Steps: 3 Home Layout: One level Home Equipment: Grab bars - toilet;Grab bars - tub/shower;Shower seat;Cane - single point;Walker - 4 wheels      Prior Function Level of Independence: Independent         Comments: Pt was able to drive prior to CVA.     Hand Dominance        Extremity/Trunk Assessment   Upper Extremity Assessment Upper Extremity Assessment: Overall WFL for tasks assessed(Pt demonstrated slightly stronger grip, elbow flexion and extension of R UE but no significant difference.  No  sensation loss reported.)    Lower Extremity Assessment Lower Extremity Assessment: Overall WFL for tasks assessed(No sensation loss reported.)    Cervical / Trunk Assessment Cervical / Trunk Assessment: Normal  Communication   Communication: HOH;Receptive difficulties  Cognition Arousal/Alertness: Awake/alert Behavior During Therapy: Flat affect Overall Cognitive Status: Within Functional Limits for tasks assessed                                 General Comments: Pt not at baseline.  Pt intermittently understands questions and then does not respond at times.      General Comments General comments (skin integrity, edema, etc.): Coordination testing: UE: finger to nose: L: 6 sec., R: 5 sec, mild tremor noted, LE: heel to shin: L: 8 sec, R: 8 sec, limited by decreased hip and knee flexibility    Exercises     Assessment/Plan    PT Assessment Patient needs continued PT services  PT Problem List Decreased strength;Decreased mobility;Decreased activity tolerance;Decreased balance;Decreased knowledge of use of DME;Decreased safety awareness       PT Treatment Interventions DME instruction;Therapeutic activities;Gait training;Therapeutic exercise;Patient/family education;Stair training;Balance training;Functional mobility training;Neuromuscular re-education    PT Goals (Current goals can be found in the Care Plan section)  Acute Rehab PT Goals Patient Stated Goal: To return home and continue to be active PT Goal Formulation: With patient/family Time For Goal Achievement: 10/29/17 Potential to Achieve Goals: Good    Frequency Min 2X/week   Barriers to discharge        Co-evaluation               AM-PAC PT "6 Clicks" Daily Activity  Outcome Measure Difficulty turning over in bed (including adjusting bedclothes, sheets and blankets)?: None Difficulty moving from lying on back to sitting on the side of the bed? : A Little Difficulty sitting down on and  standing up from a chair with arms (e.g., wheelchair, bedside commode, etc,.)?: A Little Help needed moving to and from a bed to chair (including a wheelchair)?: A Little Help needed walking in hospital room?: A Little Help needed climbing 3-5 steps with a railing? : A Little 6 Click Score: 19    End of Session Equipment Utilized During Treatment: Gait belt Activity Tolerance: Patient tolerated treatment well Patient left: in bed;with call bell/phone within reach;with bed alarm set;with family/visitor present   PT Visit Diagnosis: Unsteadiness on feet (R26.81);Other abnormalities of gait and mobility (R26.89)    Time: 1100-1120 PT Time Calculation (min) (ACUTE ONLY): 20 min   Charges:   PT Evaluation $PT Eval Low Complexity: 1 Low PT Treatments $Gait Training: 8-22 mins   PT G Codes:   PT G-Codes **NOT FOR INPATIENT CLASS** Functional Assessment Tool Used: AM-PAC 6 Clicks Basic Mobility    Glenetta Hew, PT, DPT   Glenetta Hew 10/08/2017, 12:27 PM

## 2017-10-08 NOTE — Progress Notes (Signed)
PT Cancellation Note  Patient Details Name: Joseph RipaJohn Little MRN: [id number] DOB: 08-08-1946   Cancelled Treatment:    Reason Eval/Treat Not Completed: Patient at procedure or test/unavailable  Pt out of room for MRI.  Will re-attempt at a later time.   Glenetta HewSarah Deena Shaub, PT, DPT 10/08/2017, 9:24 AM

## 2018-03-08 ENCOUNTER — Inpatient Hospital Stay
Admission: EM | Admit: 2018-03-08 | Discharge: 2018-03-14 | DRG: 291 | Disposition: A | Discharge status: Hospice | Payer: Medicare Other | Attending: Internal Medicine | Admitting: Internal Medicine

## 2018-03-08 ENCOUNTER — Emergency Department: Payer: Medicare Other

## 2018-03-08 ENCOUNTER — Encounter: Payer: Self-pay | Admitting: Emergency Medicine

## 2018-03-08 ENCOUNTER — Other Ambulatory Visit: Payer: Self-pay

## 2018-03-08 DIAGNOSIS — Z66 Do not resuscitate: Secondary | ICD-10-CM | POA: Diagnosis present

## 2018-03-08 DIAGNOSIS — E872 Acidosis: Secondary | ICD-10-CM | POA: Diagnosis present

## 2018-03-08 DIAGNOSIS — J189 Pneumonia, unspecified organism: Secondary | ICD-10-CM | POA: Diagnosis present

## 2018-03-08 DIAGNOSIS — I480 Paroxysmal atrial fibrillation: Secondary | ICD-10-CM | POA: Diagnosis present

## 2018-03-08 DIAGNOSIS — D649 Anemia, unspecified: Secondary | ICD-10-CM | POA: Diagnosis present

## 2018-03-08 DIAGNOSIS — J9622 Acute and chronic respiratory failure with hypercapnia: Secondary | ICD-10-CM | POA: Diagnosis present

## 2018-03-08 DIAGNOSIS — N183 Chronic kidney disease, stage 3 (moderate): Secondary | ICD-10-CM | POA: Diagnosis present

## 2018-03-08 DIAGNOSIS — I5021 Acute systolic (congestive) heart failure: Secondary | ICD-10-CM | POA: Diagnosis present

## 2018-03-08 DIAGNOSIS — E1165 Type 2 diabetes mellitus with hyperglycemia: Secondary | ICD-10-CM | POA: Diagnosis present

## 2018-03-08 DIAGNOSIS — F17200 Nicotine dependence, unspecified, uncomplicated: Secondary | ICD-10-CM | POA: Diagnosis present

## 2018-03-08 DIAGNOSIS — I248 Other forms of acute ischemic heart disease: Secondary | ICD-10-CM | POA: Diagnosis present

## 2018-03-08 DIAGNOSIS — R000 Tachycardia, unspecified: Secondary | ICD-10-CM | POA: Diagnosis present

## 2018-03-08 DIAGNOSIS — Z515 Encounter for palliative care: Secondary | ICD-10-CM

## 2018-03-08 DIAGNOSIS — J441 Chronic obstructive pulmonary disease with (acute) exacerbation: Secondary | ICD-10-CM | POA: Diagnosis not present

## 2018-03-08 DIAGNOSIS — I509 Heart failure, unspecified: Secondary | ICD-10-CM

## 2018-03-08 DIAGNOSIS — Z8673 Personal history of transient ischemic attack (TIA), and cerebral infarction without residual deficits: Secondary | ICD-10-CM

## 2018-03-08 DIAGNOSIS — R0602 Shortness of breath: Secondary | ICD-10-CM | POA: Diagnosis not present

## 2018-03-08 DIAGNOSIS — I959 Hypotension, unspecified: Secondary | ICD-10-CM | POA: Diagnosis present

## 2018-03-08 DIAGNOSIS — I130 Hypertensive heart and chronic kidney disease with heart failure and stage 1 through stage 4 chronic kidney disease, or unspecified chronic kidney disease: Principal | ICD-10-CM | POA: Diagnosis present

## 2018-03-08 DIAGNOSIS — E1122 Type 2 diabetes mellitus with diabetic chronic kidney disease: Secondary | ICD-10-CM | POA: Diagnosis present

## 2018-03-08 DIAGNOSIS — J9621 Acute and chronic respiratory failure with hypoxia: Secondary | ICD-10-CM | POA: Diagnosis present

## 2018-03-08 DIAGNOSIS — J9601 Acute respiratory failure with hypoxia: Secondary | ICD-10-CM | POA: Diagnosis not present

## 2018-03-08 DIAGNOSIS — I5082 Biventricular heart failure: Secondary | ICD-10-CM | POA: Diagnosis present

## 2018-03-08 DIAGNOSIS — E785 Hyperlipidemia, unspecified: Secondary | ICD-10-CM | POA: Diagnosis present

## 2018-03-08 DIAGNOSIS — Z7189 Other specified counseling: Secondary | ICD-10-CM | POA: Diagnosis not present

## 2018-03-08 DIAGNOSIS — R319 Hematuria, unspecified: Secondary | ICD-10-CM | POA: Diagnosis present

## 2018-03-08 DIAGNOSIS — Z8249 Family history of ischemic heart disease and other diseases of the circulatory system: Secondary | ICD-10-CM | POA: Diagnosis not present

## 2018-03-08 DIAGNOSIS — N179 Acute kidney failure, unspecified: Secondary | ICD-10-CM | POA: Diagnosis present

## 2018-03-08 DIAGNOSIS — J9690 Respiratory failure, unspecified, unspecified whether with hypoxia or hypercapnia: Secondary | ICD-10-CM | POA: Diagnosis present

## 2018-03-08 DIAGNOSIS — J440 Chronic obstructive pulmonary disease with acute lower respiratory infection: Secondary | ICD-10-CM | POA: Diagnosis present

## 2018-03-08 DIAGNOSIS — Z79899 Other long term (current) drug therapy: Secondary | ICD-10-CM

## 2018-03-08 DIAGNOSIS — E875 Hyperkalemia: Secondary | ICD-10-CM | POA: Diagnosis present

## 2018-03-08 DIAGNOSIS — Z7982 Long term (current) use of aspirin: Secondary | ICD-10-CM

## 2018-03-08 DIAGNOSIS — Z7984 Long term (current) use of oral hypoglycemic drugs: Secondary | ICD-10-CM

## 2018-03-08 LAB — LACTIC ACID, PLASMA: Lactic Acid, Venous: 5.4 mmol/L (ref 0.5–1.9)

## 2018-03-08 LAB — CBC WITH DIFFERENTIAL/PLATELET
BASOS ABS: 0.2 10*3/uL — AB (ref 0–0.1)
Basophils Relative: 1 %
EOS ABS: 0.2 10*3/uL (ref 0–0.7)
Eosinophils Relative: 1 %
HCT: 36.8 % — ABNORMAL LOW (ref 40.0–52.0)
HEMOGLOBIN: 11.9 g/dL — AB (ref 13.0–18.0)
LYMPHS PCT: 20 %
Lymphs Abs: 4.6 10*3/uL — ABNORMAL HIGH (ref 1.0–3.6)
MCH: 31.7 pg (ref 26.0–34.0)
MCHC: 32.4 g/dL (ref 32.0–36.0)
MCV: 97.8 fL (ref 80.0–100.0)
Monocytes Absolute: 0.7 10*3/uL (ref 0.2–1.0)
Monocytes Relative: 3 %
Neutro Abs: 17.2 10*3/uL — ABNORMAL HIGH (ref 1.4–6.5)
Neutrophils Relative %: 75 %
PLATELETS: 211 10*3/uL (ref 150–440)
RBC: 3.76 MIL/uL — AB (ref 4.40–5.90)
RDW: 14.2 % (ref 11.5–14.5)
WBC: 23 10*3/uL — ABNORMAL HIGH (ref 3.8–10.6)

## 2018-03-08 LAB — BASIC METABOLIC PANEL
Anion gap: 16 — ABNORMAL HIGH (ref 5–15)
BUN: 38 mg/dL — ABNORMAL HIGH (ref 8–23)
CHLORIDE: 109 mmol/L (ref 98–111)
CO2: 14 mmol/L — ABNORMAL LOW (ref 22–32)
Calcium: 8.7 mg/dL — ABNORMAL LOW (ref 8.9–10.3)
Creatinine, Ser: 1.81 mg/dL — ABNORMAL HIGH (ref 0.61–1.24)
GFR calc Af Amer: 42 mL/min — ABNORMAL LOW (ref >60)
GFR, EST NON AFRICAN AMERICAN: 36 mL/min — AB (ref >60)
Glucose, Bld: 282 mg/dL — ABNORMAL HIGH (ref 70–99)
POTASSIUM: 4.7 mmol/L (ref 3.5–5.1)
SODIUM: 139 mmol/L (ref 135–145)

## 2018-03-08 LAB — BRAIN NATRIURETIC PEPTIDE
B NATRIURETIC PEPTIDE 5: 600 pg/mL — AB (ref 0.0–100.0)
B Natriuretic Peptide: 685 pg/mL — ABNORMAL HIGH (ref 0.0–100.0)

## 2018-03-08 LAB — PROCALCITONIN: PROCALCITONIN: 0.41 ng/mL

## 2018-03-08 LAB — GLUCOSE, CAPILLARY
GLUCOSE-CAPILLARY: 263 mg/dL — AB (ref 70–99)
GLUCOSE-CAPILLARY: 428 mg/dL — AB (ref 70–99)

## 2018-03-08 LAB — TROPONIN I: Troponin I: 0.19 ng/mL (ref <0.03)

## 2018-03-08 MED ORDER — METHYLPREDNISOLONE SODIUM SUCC 125 MG IJ SOLR
60.0000 mg | Freq: Two times a day (BID) | INTRAMUSCULAR | Status: DC
  Administered 2018-03-09 – 2018-03-11 (×6): 60 mg via INTRAVENOUS
  Filled 2018-03-08 (×7): qty 2

## 2018-03-08 MED ORDER — IPRATROPIUM-ALBUTEROL 0.5-2.5 (3) MG/3ML IN SOLN: 3.0000 mL | Freq: Four times a day (QID) | RESPIRATORY_TRACT | Status: DC

## 2018-03-08 MED ORDER — INSULIN ASPART 100 UNIT/ML ~~LOC~~ SOLN: 0.0000 [IU] | Freq: Every day | SUBCUTANEOUS | Status: DC

## 2018-03-08 MED ORDER — SODIUM CHLORIDE 0.9% FLUSH
3.0000 mL | Freq: Two times a day (BID) | INTRAVENOUS | Status: DC
  Administered 2018-03-08 – 2018-03-14 (×11): 3 mL via INTRAVENOUS

## 2018-03-08 MED ORDER — ASPIRIN EC 325 MG PO TBEC
325.0000 mg | DELAYED_RELEASE_TABLET | Freq: Every day | ORAL | Status: DC
  Administered 2018-03-09 – 2018-03-14 (×5): 325 mg via ORAL
  Filled 2018-03-08 (×5): qty 1

## 2018-03-08 MED ORDER — METHYLPREDNISOLONE SODIUM SUCC 125 MG IJ SOLR
125.0000 mg | Freq: Once | INTRAMUSCULAR | Status: AC
  Administered 2018-03-08: 125 mg via INTRAVENOUS
  Filled 2018-03-08: qty 2

## 2018-03-08 MED ORDER — LEVOFLOXACIN IN D5W 500 MG/100ML IV SOLN: 500.0000 mg | INTRAVENOUS | Status: DC

## 2018-03-08 MED ORDER — METHYLPREDNISOLONE SODIUM SUCC 125 MG IJ SOLR: 60.0000 mg | Freq: Four times a day (QID) | INTRAMUSCULAR | Status: DC

## 2018-03-08 MED ORDER — POLYETHYLENE GLYCOL 3350 17 G PO PACK: 17.0000 g | PACK | Freq: Every day | ORAL | Status: DC | PRN

## 2018-03-08 MED ORDER — CHLORHEXIDINE GLUCONATE 0.12 % MT SOLN
15.0000 mL | Freq: Two times a day (BID) | OROMUCOSAL | Status: DC
  Administered 2018-03-08 – 2018-03-13 (×5): 15 mL via OROMUCOSAL
  Filled 2018-03-08 (×5): qty 15

## 2018-03-08 MED ORDER — GLIPIZIDE 5 MG PO TABS
5.0000 mg | ORAL_TABLET | Freq: Two times a day (BID) | ORAL | Status: DC
  Administered 2018-03-09: 5 mg via ORAL
  Filled 2018-03-08 (×4): qty 1

## 2018-03-08 MED ORDER — MAGNESIUM SULFATE 2 GM/50ML IV SOLN
2.0000 g | Freq: Once | INTRAVENOUS | Status: AC
  Administered 2018-03-08: 2 g via INTRAVENOUS
  Filled 2018-03-08: qty 50

## 2018-03-08 MED ORDER — POTASSIUM CHLORIDE IN NACL 20-0.45 MEQ/L-% IV SOLN: INTRAVENOUS | Status: DC

## 2018-03-08 MED ORDER — IPRATROPIUM-ALBUTEROL 0.5-2.5 (3) MG/3ML IN SOLN
9.0000 mL | Freq: Once | RESPIRATORY_TRACT | Status: AC
  Administered 2018-03-08: 9 mL via RESPIRATORY_TRACT
  Filled 2018-03-08: qty 9

## 2018-03-08 MED ORDER — INSULIN ASPART 100 UNIT/ML ~~LOC~~ SOLN
0.0000 [IU] | SUBCUTANEOUS | Status: DC
  Administered 2018-03-08: 20 [IU] via SUBCUTANEOUS
  Administered 2018-03-09: 7 [IU] via SUBCUTANEOUS
  Administered 2018-03-09: 4 [IU] via SUBCUTANEOUS
  Administered 2018-03-09: 7 [IU] via SUBCUTANEOUS
  Administered 2018-03-09: 20 [IU] via SUBCUTANEOUS
  Administered 2018-03-09: 3 [IU] via SUBCUTANEOUS
  Administered 2018-03-10: 4 [IU] via SUBCUTANEOUS
  Administered 2018-03-10 (×2): 3 [IU] via SUBCUTANEOUS
  Administered 2018-03-10: 7 [IU] via SUBCUTANEOUS
  Administered 2018-03-10 (×2): 11 [IU] via SUBCUTANEOUS
  Administered 2018-03-11 (×2): 4 [IU] via SUBCUTANEOUS
  Administered 2018-03-11: 3 [IU] via SUBCUTANEOUS
  Administered 2018-03-11: 4 [IU] via SUBCUTANEOUS
  Administered 2018-03-11: 11 [IU] via SUBCUTANEOUS
  Administered 2018-03-12 (×4): 4 [IU] via SUBCUTANEOUS
  Administered 2018-03-12: 3 [IU] via SUBCUTANEOUS
  Administered 2018-03-12: 15 [IU] via SUBCUTANEOUS
  Administered 2018-03-13: 3 [IU] via SUBCUTANEOUS
  Administered 2018-03-13: 11 [IU] via SUBCUTANEOUS
  Administered 2018-03-13: 3 [IU] via SUBCUTANEOUS
  Administered 2018-03-13: 20 [IU] via SUBCUTANEOUS
  Administered 2018-03-14: 7 [IU] via SUBCUTANEOUS
  Administered 2018-03-14: 4 [IU] via SUBCUTANEOUS
  Filled 2018-03-08 (×29): qty 1

## 2018-03-08 MED ORDER — ONDANSETRON HCL 4 MG PO TABS: 4.0000 mg | ORAL_TABLET | Freq: Four times a day (QID) | ORAL | Status: DC | PRN

## 2018-03-08 MED ORDER — MORPHINE SULFATE (PF) 2 MG/ML IV SOLN: 2.0000 mg | INTRAVENOUS | Status: DC | PRN

## 2018-03-08 MED ORDER — IPRATROPIUM-ALBUTEROL 0.5-2.5 (3) MG/3ML IN SOLN
3.0000 mL | RESPIRATORY_TRACT | Status: DC
  Administered 2018-03-08 – 2018-03-14 (×35): 3 mL via RESPIRATORY_TRACT
  Filled 2018-03-08 (×7): qty 3
  Filled 2018-03-08: qty 42
  Filled 2018-03-08 (×27): qty 3

## 2018-03-08 MED ORDER — SODIUM CHLORIDE 0.9 % IV SOLN: 250.0000 mL | INTRAVENOUS | Status: DC | PRN

## 2018-03-08 MED ORDER — LISINOPRIL 5 MG PO TABS
5.0000 mg | ORAL_TABLET | Freq: Every day | ORAL | Status: DC
  Administered 2018-03-09: 5 mg via ORAL
  Filled 2018-03-08: qty 1

## 2018-03-08 MED ORDER — SODIUM CHLORIDE 0.9 % IV SOLN
2.0000 g | Freq: Once | INTRAVENOUS | Status: AC
  Administered 2018-03-08: 2 g via INTRAVENOUS
  Filled 2018-03-08: qty 2

## 2018-03-08 MED ORDER — VANCOMYCIN HCL IN DEXTROSE 1-5 GM/200ML-% IV SOLN
1000.0000 mg | Freq: Once | INTRAVENOUS | Status: AC
  Administered 2018-03-08: 1000 mg via INTRAVENOUS
  Filled 2018-03-08: qty 200

## 2018-03-08 MED ORDER — SODIUM CHLORIDE 0.9% FLUSH
3.0000 mL | Freq: Two times a day (BID) | INTRAVENOUS | Status: DC
  Administered 2018-03-09 – 2018-03-11 (×4): 3 mL via INTRAVENOUS

## 2018-03-08 MED ORDER — ATORVASTATIN CALCIUM 20 MG PO TABS
40.0000 mg | ORAL_TABLET | Freq: Every day | ORAL | Status: DC
  Administered 2018-03-09 – 2018-03-13 (×5): 40 mg via ORAL
  Filled 2018-03-08 (×5): qty 2

## 2018-03-08 MED ORDER — CARVEDILOL 6.25 MG PO TABS
6.2500 mg | ORAL_TABLET | Freq: Two times a day (BID) | ORAL | Status: DC
  Administered 2018-03-09 (×2): 6.25 mg via ORAL
  Filled 2018-03-08 (×2): qty 1

## 2018-03-08 MED ORDER — HYDROCODONE-ACETAMINOPHEN 5-325 MG PO TABS
1.0000 | ORAL_TABLET | ORAL | Status: DC | PRN
Start: 2018-03-08 — End: 2018-03-14

## 2018-03-08 MED ORDER — FAMOTIDINE IN NACL 20-0.9 MG/50ML-% IV SOLN
20.0000 mg | Freq: Two times a day (BID) | INTRAVENOUS | Status: DC
  Administered 2018-03-08 – 2018-03-09 (×2): 20 mg via INTRAVENOUS
  Filled 2018-03-08 (×2): qty 50

## 2018-03-08 MED ORDER — HEPARIN SODIUM (PORCINE) 5000 UNIT/ML IJ SOLN
5000.0000 [IU] | Freq: Three times a day (TID) | INTRAMUSCULAR | Status: DC
  Filled 2018-03-08 (×5): qty 1

## 2018-03-08 MED ORDER — ORAL CARE MOUTH RINSE
15.0000 mL | Freq: Two times a day (BID) | OROMUCOSAL | Status: DC
  Administered 2018-03-09 – 2018-03-11 (×3): 15 mL via OROMUCOSAL

## 2018-03-08 MED ORDER — GUAIFENESIN ER 600 MG PO TB12
600.0000 mg | ORAL_TABLET | Freq: Two times a day (BID) | ORAL | Status: DC
  Administered 2018-03-09 – 2018-03-14 (×9): 600 mg via ORAL
  Filled 2018-03-08 (×9): qty 1

## 2018-03-08 MED ORDER — DOCUSATE SODIUM 100 MG PO CAPS
100.0000 mg | ORAL_CAPSULE | Freq: Two times a day (BID) | ORAL | Status: DC
  Administered 2018-03-09 – 2018-03-14 (×7): 100 mg via ORAL
  Filled 2018-03-08 (×8): qty 1

## 2018-03-08 MED ORDER — INSULIN ASPART 100 UNIT/ML ~~LOC~~ SOLN: 0.0000 [IU] | Freq: Three times a day (TID) | SUBCUTANEOUS | Status: DC

## 2018-03-08 MED ORDER — ACETAMINOPHEN 650 MG RE SUPP: 650.0000 mg | Freq: Four times a day (QID) | RECTAL | Status: DC | PRN

## 2018-03-08 MED ORDER — SODIUM CHLORIDE 0.9% FLUSH: 3.0000 mL | INTRAVENOUS | Status: DC | PRN

## 2018-03-08 MED ORDER — AZITHROMYCIN 500 MG IV SOLR
500.0000 mg | Freq: Once | INTRAVENOUS | Status: AC
  Administered 2018-03-08: 500 mg via INTRAVENOUS
  Filled 2018-03-08: qty 500

## 2018-03-08 MED ORDER — SODIUM CHLORIDE 0.9 % IV SOLN
500.0000 mg | INTRAVENOUS | Status: DC
  Administered 2018-03-09 – 2018-03-10 (×2): 500 mg via INTRAVENOUS
  Filled 2018-03-08 (×3): qty 500

## 2018-03-08 MED ORDER — AMLODIPINE BESYLATE 10 MG PO TABS
10.0000 mg | ORAL_TABLET | Freq: Every day | ORAL | Status: DC
  Administered 2018-03-09: 10 mg via ORAL
  Filled 2018-03-08: qty 1

## 2018-03-08 MED ORDER — ACETAMINOPHEN 325 MG PO TABS: 650.0000 mg | ORAL_TABLET | Freq: Four times a day (QID) | ORAL | Status: DC | PRN

## 2018-03-08 MED ORDER — ONDANSETRON HCL 4 MG/2ML IJ SOLN: 4.0000 mg | Freq: Four times a day (QID) | INTRAMUSCULAR | Status: DC | PRN

## 2018-03-08 MED ORDER — INSULIN GLARGINE 100 UNIT/ML ~~LOC~~ SOLN
25.0000 [IU] | Freq: Every day | SUBCUTANEOUS | Status: DC
  Administered 2018-03-08 – 2018-03-12 (×5): 25 [IU] via SUBCUTANEOUS
  Filled 2018-03-08 (×6): qty 0.25

## 2018-03-08 MED ORDER — FUROSEMIDE 10 MG/ML IJ SOLN
40.0000 mg | Freq: Once | INTRAMUSCULAR | Status: AC
  Administered 2018-03-08: 40 mg via INTRAVENOUS
  Filled 2018-03-08: qty 4

## 2018-03-08 MED ORDER — NICOTINE 14 MG/24HR TD PT24
14.0000 mg | MEDICATED_PATCH | Freq: Every day | TRANSDERMAL | Status: DC
  Administered 2018-03-08 – 2018-03-14 (×7): 14 mg via TRANSDERMAL
  Filled 2018-03-08 (×7): qty 1

## 2018-03-08 MED ORDER — BUDESONIDE 0.5 MG/2ML IN SUSP
0.5000 mg | Freq: Two times a day (BID) | RESPIRATORY_TRACT | Status: DC
  Administered 2018-03-09 – 2018-03-14 (×11): 0.5 mg via RESPIRATORY_TRACT
  Filled 2018-03-08 (×12): qty 2

## 2018-03-08 NOTE — Consult Note (Signed)
[facility] Pulmonary Medicine Consultation      Name: Joseph RipaJohn Little MRN: [id number] DOB: 1946-07-28    ADMISSION DATE:  03/08/2018 CONSULTATION DATE:  03/08/2018  REFERRING MD :  Katheren ShamsSALARY   CHIEF COMPLAINT:   Shortness of breath   HISTORY OF PRESENT ILLNESS   72 year old male with history of CVA, hypertension, and diabetes who presents to the ED for evaluation of shortness of breath with associated non-productive cough. He states the shortness of breath started last night and has gotten progressively worse. He tells me he has smoke 1 PPD x 40 years. On arrival to the ED patient was noted to have an O2 sat of 48% on room air with cyanosis. He was placed on 15L non-rebreather with improvement in 87%. BiPAP was then placed and patient's O2 sat came up to 98. Initial labs in the ED showed leukocytosis of 23K with neutrophil predominance and creatinine of 1.81. Patient given duonebs, methylprednisolone, magnesium, and antibiotics in ICU. Code sepsis called.   On arrival to the ICU patient on 40% FiO2 with tachypnea and tachycardia. Increased work of breathing noted.         PAST MEDICAL HISTORY    :  Past Medical History:  Diagnosis Date  . Diabetes mellitus without complication (HCC)   . Hypertension    Past Surgical History:  Procedure Laterality Date  . APPENDECTOMY    . FOOT SURGERY     Prior to Admission medications   Medication Sig Start Date End Date Taking? Authorizing Provider  amLODipine (NORVASC) 10 MG tablet Take 1 tablet (10 mg total) by mouth daily. 10/08/17 10/08/18 Yes Adrian SaranMody, Sital, MD  aspirin EC 325 MG tablet Take 1 tablet (325 mg total) by mouth daily. 10/08/17 10/08/18 Yes Adrian SaranMody, Sital, MD  atorvastatin (LIPITOR) 40 MG tablet Take 1 tablet (40 mg total) by mouth daily at 6 PM. 10/08/17  Yes Mody, Sital, MD  glipiZIDE (GLUCOTROL) 5 MG tablet Take 1 tablet (5 mg total) by mouth 2 (two) times daily. 10/08/17 10/08/18 Yes Mody, Patricia PesaSital, MD  lisinopril (PRINIVIL,ZESTRIL) 5 MG  tablet Take 5 mg by mouth daily. 01/22/18  Yes [provider]  metFORMIN (GLUCOPHAGE) 1000 MG tablet Take 1 tablet (1,000 mg total) by mouth 2 (two) times daily with a meal. 10/08/17 03/08/18 Yes Mody, Sital, MD   No Known Allergies   FAMILY HISTORY   No family history on file.    SOCIAL HISTORY    reports that he has been smoking.  He has never used smokeless tobacco. He reports that he does not drink alcohol or use drugs.  Review of Systems  Constitutional: Negative for chills, fever and malaise/fatigue.  HENT: Negative for congestion, ear pain, sinus pain and sore throat.   Eyes: Negative for pain and discharge.  Respiratory: Positive for cough, shortness of breath and wheezing. Negative for hemoptysis and sputum production.   Cardiovascular: Negative for chest pain, palpitations, orthopnea, leg swelling and PND.  Gastrointestinal: Negative for abdominal pain, constipation, diarrhea, nausea and vomiting.  Genitourinary: Negative for dysuria, frequency and urgency.  Musculoskeletal: Negative for myalgias.  Skin: Negative for itching and rash.  Neurological: Negative for weakness and headaches.      VITAL SIGNS    Pulse Rate:  [102-115] 111 (08/01 1714) Resp:  [23-48] 23 (08/01 1714) BP: (122-170)/(56-80) 131/56 (08/01 1714) SpO2:  [54 %-96 %] 92 % (08/01 1626) Weight:  [81.6 kg (180 lb)] 81.6 kg (180 lb) (08/01 1617) HEMODYNAMICS:   VENTILATOR SETTINGS:  INTAKE / OUTPUT:  Intake/Output Summary (Last 24 hours) at 03/08/2018 1721 Last data filed at 03/08/2018 1635 Gross per 24 hour  Intake 400 ml  Output -  Net 400 ml       PHYSICAL EXAM   Physical Exam  Constitutional: He is oriented to person, place, and time.  Elderly male, appears stated age, in respiratory distress on BiPAP  HENT:  Head: Normocephalic and atraumatic.  Eyes: Pupils are equal, round, and reactive to light.  Cardiovascular:  Tachycardic, regular rhythm. Normal S1 and S2. No  murmurs, rubs, gallops. Dorsalis pedis pulses 1+ bilaterally.   Pulmonary/Chest: He is in respiratory distress.  Bilateral expiratory wheezing heard anteriorly. No rales, rhonchi.   Abdominal:  Soft, non-distended abdomen. No tenderness to palpation. Positive bowel sounds.   Musculoskeletal: Normal range of motion. He exhibits no edema.  Lymphadenopathy:    He has no cervical adenopathy.  Neurological: He is alert and oriented to person, place, and time. No cranial nerve deficit.  Skin: Skin is warm and dry. Capillary refill takes less than 2 seconds.       LABS   LABS:  CBC Recent Labs  Lab 03/08/18 1422  WBC 23.0*  HGB 11.9*  HCT 36.8*  PLT 211   Coag's No results for input(s): APTT, INR in the last 168 hours. BMET Recent Labs  Lab 03/08/18 1422  NA 139  K 4.7  CL 109  CO2 14*  BUN 38*  CREATININE 1.81*  GLUCOSE 282*   Electrolytes Recent Labs  Lab 03/08/18 1422  CALCIUM 8.7*   Sepsis Markers Recent Labs  Lab 03/08/18 1603  LATICACIDVEN 5.4*   ABG No results for input(s): PHART, PCO2ART, PO2ART in the last 168 hours. Liver Enzymes No results for input(s): AST, ALT, ALKPHOS, BILITOT, ALBUMIN in the last 168 hours. Cardiac Enzymes Recent Labs  Lab 03/08/18 1422  TROPONINI 0.19*   Glucose Recent Labs  Lab 03/08/18 1445  GLUCAP 263*     No results found for this or any previous visit (from the past 240 hour(s)).   Current Facility-Administered Medications:  .  0.9 %  sodium chloride infusion, 250 mL, Intravenous, PRN, Salary, Montell D, MD .  acetaminophen (TYLENOL) tablet 650 mg, 650 mg, Oral, Q6H PRN **OR** acetaminophen (TYLENOL) suppository 650 mg, 650 mg, Rectal, Q6H PRN, Salary, Montell D, MD .  amLODipine (NORVASC) tablet 10 mg, 10 mg, Oral, Daily, Salary, Montell D, MD .  aspirin EC tablet 325 mg, 325 mg, Oral, Daily, Salary, Montell D, MD .  atorvastatin (LIPITOR) tablet 40 mg, 40 mg, Oral, q1800, Salary, Montell D, MD .   budesonide (PULMICORT) nebulizer solution 0.5 mg, 0.5 mg, Nebulization, BID, Salary, Montell D, MD .  carvedilol (COREG) tablet 6.25 mg, 6.25 mg, Oral, BID WC, Salary, Montell D, MD .  docusate sodium (COLACE) capsule 100 mg, 100 mg, Oral, BID, Salary, Montell D, MD .  famotidine (PEPCID) IVPB 20 mg premix, 20 mg, Intravenous, Q12H, Salary, Montell D, MD .  glipiZIDE (GLUCOTROL) tablet 5 mg, 5 mg, Oral, BID, Salary, Montell D, MD .  guaiFENesin (MUCINEX) 12 hr tablet 600 mg, 600 mg, Oral, BID, Salary, Montell D, MD .  heparin injection 5,000 Units, 5,000 Units, Subcutaneous, Q8H, Salary, Montell D, MD .  HYDROcodone-acetaminophen (NORCO/VICODIN) 5-325 MG per tablet 1-2 tablet, 1-2 tablet, Oral, Q4H PRN, Salary, Montell D, MD .  Melene Muller ON 03/09/2018] insulin aspart (novoLOG) injection 0-20 Units, 0-20 Units, Subcutaneous, TID WC, Salary, Montell D, MD .  insulin aspart (novoLOG) injection 0-5 Units, 0-5 Units, Subcutaneous, QHS, Salary, Montell D, MD .  insulin glargine (LANTUS) injection 25 Units, 25 Units, Subcutaneous, QHS, Salary, Montell D, MD .  ipratropium-albuterol (DUONEB) 0.5-2.5 (3) MG/3ML nebulizer solution 3 mL, 3 mL, Nebulization, Q4H, Kasa, Kurian, MD .  lisinopril (PRINIVIL,ZESTRIL) tablet 5 mg, 5 mg, Oral, Daily, Salary, Montell D, MD .  Melene Muller ON 03/09/2018] methylPREDNISolone sodium succinate (SOLU-MEDROL) 125 mg/2 mL injection 60 mg, 60 mg, Intravenous, Q12H, Kasa, Kurian, MD .  morphine 2 MG/ML injection 2 mg, 2 mg, Intravenous, Q1H PRN, Belia Heman, Kurian, MD .  nicotine (NICODERM CQ - dosed in mg/24 hours) patch 14 mg, 14 mg, Transdermal, Daily, Salary, Montell D, MD .  ondansetron (ZOFRAN) tablet 4 mg, 4 mg, Oral, Q6H PRN **OR** ondansetron (ZOFRAN) injection 4 mg, 4 mg, Intravenous, Q6H PRN, Salary, Montell D, MD .  polyethylene glycol (MIRALAX / GLYCOLAX) packet 17 g, 17 g, Oral, Daily PRN, Salary, Montell D, MD .  sodium chloride flush (NS) 0.9 % injection 3 mL, 3 mL, Intravenous,  Q12H, Salary, Montell D, MD .  sodium chloride flush (NS) 0.9 % injection 3 mL, 3 mL, Intravenous, Q12H, Salary, Montell D, MD .  sodium chloride flush (NS) 0.9 % injection 3 mL, 3 mL, Intravenous, PRN, Salary, Montell D, MD .  vancomycin (VANCOCIN) IVPB 1000 mg/200 mL premix, 1,000 mg, Intravenous, Once, Myrna Blazer, MD, Last Rate: 200 mL/hr at 03/08/18 1635, 1,000 mg at 03/08/18 1635  IMAGING    Dg Chest Port 1 View  Result Date: 03/08/2018 CLINICAL DATA:  Shortness of breath. EXAM: PORTABLE CHEST 1 VIEW COMPARISON:  None. FINDINGS: Heart size upper limits of normal. Aortic atherosclerosis. Acute interstitial and early alveolar edema pattern. No visible effusion. No consolidation or lobar collapse. No bone abnormality. IMPRESSION: Acute pulmonary edema. Primarily interstitial, with early alveolar edema. Electronically Signed   By: Paulina Fusi M.D.   On: 03/08/2018 14:59    MICRO DATA: MRSA PCR: pending Urine; pending Blood: drawn 8/1 pending     ASSESSMENT/PLAN   72 year old male with history of hypertension, diabetes mellitus, and CVA (10/2017) who presented to the ED for evaluation of SOB. Patient transported to the ICU on BiPAP (40%). Increased to 55% after evaluation.   PULMONARY A: Acute hypoxic respiratory failure secondary to COPD exacerbation and pulmonary edema P: Wean BiPAP as tolerated   -Continue duonebs, pulmicort, methylprednisolone -CXR with bilateral vascular congestion: continue lasix -Azithromycin -Procalcitonin pending -WBC 23.0 with neutrophil premodinance   CARDIOVASCULAR A: Probable pulmonary edema, elevated troponin (0.19) P:  -Echo ordered by Dr. Katheren Shams -Continue lasix -trend troponin: elevation likely secondary to demand ischemia   RENAL A:  AKI  P:  Creatinine 1.81, will trend -hold IVF hydration in setting of probable pulmonary edema -avoid nephrotoxic agents  GASTROINTESTINAL -pepcid for SUP -no current  concerns  HEMATOLOGIC -no current concerns heparin subQ for DVT prophylaxis is setting of AKI  INFECTIOUS -azithromycin started -leukocytosis: 23.0k, will trend -BC drawn 8/1 -procalcitonin pending  ENDOCRINE A:  Diabetes mellitus  P:  Continue outpatient medications (glipizide) -hold metformin with AKI -SSI -monitor CBG with steroid use    NEUROLOGIC -no current concerns    I have personally obtained a history, examined the patient, evaluated laboratory and independently reviewed  imaging results, formulated the assessment and plan and placed orders.  The Patient requires high complexity decision making for assessment and support, frequent evaluation and titration of therapies, application of advanced monitoring technologies  and extensive interpretation of multiple databases. Critical Care Time devoted to patient care services described in this note is 48 minutes.   Overall, patient is critically ill, prognosis is guarded. Patient at high risk for cardiac arrest and death.

## 2018-03-08 NOTE — H&P (Signed)
[facility] [place] [facility]   PATIENT NAME: Joseph Little    MR#:  [id number]  DATE OF BIRTH:  12/10/1945  DATE OF ADMISSION:  03/08/2018  PRIMARY CARE PHYSICIAN: Gracelyn NurseJohnston, Amori D, MD   REQUESTING/REFERRING PHYSICIAN:   CHIEF COMPLAINT:   Chief Complaint  Patient presents with  . Respiratory Distress    HISTORY OF PRESENT ILLNESS: Joseph Little  is a 72 y.o. male with a known history per below presents emergency room with 12 to 18 hours of worsening shortness of breath, patient was driven to the emergency room, found to be hypoxic with O2 saturation in the 40s, cyanotic going evaluation, lethargic, initially placed on nonrebreather 15 L which had to be changed to BiPAP, ER work-up noted for creatinine 1.8, glucose 282, white count 23,000, EKG noted for sinus tachycardia with heart rate 128, chest x-ray noted for edema, patient evaluated at the bedside, wife present, patient only able to speak in short sentences, patient is now being admitted for acute hypoxic respiratory failure secondary to acute on COPD exacerbation with right-sided heart failure.  PAST MEDICAL HISTORY:   Past Medical History:  Diagnosis Date  . Diabetes mellitus without complication (HCC)   . Hypertension     PAST SURGICAL HISTORY:  Past Surgical History:  Procedure Laterality Date  . APPENDECTOMY    . FOOT SURGERY      SOCIAL HISTORY:  Social History   Tobacco Use  . Smoking status: Current Every Day Smoker  . Smokeless tobacco: Never Used  Substance Use Topics  . Alcohol use: No    Frequency: Never    FAMILY HISTORY:  HTN  DRUG ALLERGIES: No Known Allergies  REVIEW OF SYSTEMS:   CONSTITUTIONAL: No fever,+ fatigue, weakness.  EYES: No blurred or double vision.  EARS, NOSE, AND THROAT: No tinnitus or ear pain.  RESPIRATORY: + cough, shortness of breath, wheezing   CARDIOVASCULAR: No chest pain,+ orthopnea, +edema.  GASTROINTESTINAL: No nausea, vomiting, diarrhea or  abdominal pain.  GENITOURINARY: No dysuria, hematuria.  ENDOCRINE: No polyuria, nocturia,  HEMATOLOGY: No anemia, easy bruising or bleeding SKIN: No rash or lesion. MUSCULOSKELETAL: No joint pain or arthritis.   NEUROLOGIC: No tingling, numbness, weakness.  PSYCHIATRY: No anxiety or depression.   MEDICATIONS AT HOME:  Prior to Admission medications   Medication Sig Start Date End Date Taking? Authorizing Provider  amLODipine (NORVASC) 10 MG tablet Take 1 tablet (10 mg total) by mouth daily. 10/08/17 10/08/18 Yes Adrian SaranMody, Sital, MD  aspirin EC 325 MG tablet Take 1 tablet (325 mg total) by mouth daily. 10/08/17 10/08/18 Yes Adrian SaranMody, Sital, MD  atorvastatin (LIPITOR) 40 MG tablet Take 1 tablet (40 mg total) by mouth daily at 6 PM. 10/08/17  Yes Mody, Sital, MD  glipiZIDE (GLUCOTROL) 5 MG tablet Take 1 tablet (5 mg total) by mouth 2 (two) times daily. 10/08/17 10/08/18 Yes Mody, Patricia PesaSital, MD  lisinopril (PRINIVIL,ZESTRIL) 5 MG tablet Take 5 mg by mouth daily. 01/22/18  Yes [provider]  metFORMIN (GLUCOPHAGE) 1000 MG tablet Take 1 tablet (1,000 mg total) by mouth 2 (two) times daily with a meal. 10/08/17 03/08/18 Yes Mody, Sital, MD      PHYSICAL EXAMINATION:   VITAL SIGNS: Blood pressure (!) 150/69, pulse (!) 106, resp. rate (!) 34, SpO2 93 %.  GENERAL:  72 y.o.-year-old patient lying in the bed with no acute distress.  Frail-appearing, obese EYES: Pupils equal, round, reactive to light and accommodation. No scleral icterus. Extraocular muscles intact.  HEENT: Head atraumatic, normocephalic. Oropharynx and nasopharynx clear.  NECK:  Supple, no jugular venous distention. No thyroid enlargement, no tenderness.  LUNGS: Severely diminished breath sounds at the bases bilaterally with rhonchi and rails, increased work of breathing on BiPAP CARDIOVASCULAR: S1, S2 normal. No murmurs, rubs, or gallops.  ABDOMEN: Soft, nontender, nondistended. Bowel sounds present. No organomegaly or mass.  EXTREMITIES: No pedal  edema, cyanosis, or clubbing.  NEUROLOGIC: Cranial nerves II through XII are intact. MAES. Sensation intact. Gait not checked.  PSYCHIATRIC: The patient is alert and oriented x 3.  SKIN: No obvious rash, lesion, or ulcer.   LABORATORY PANEL:   CBC Recent Labs  Lab 03/08/18 1422  WBC 23.0*  HGB 11.9*  HCT 36.8*  PLT 211  MCV 97.8  MCH 31.7  MCHC 32.4  RDW 14.2  LYMPHSABS 4.6*  MONOABS 0.7  EOSABS 0.2  BASOSABS 0.2*   ------------------------------------------------------------------------------------------------------------------  Chemistries  Recent Labs  Lab 03/08/18 1422  NA 139  K 4.7  CL 109  CO2 14*  GLUCOSE 282*  BUN 38*  CREATININE 1.81*  CALCIUM 8.7*   ------------------------------------------------------------------------------------------------------------------ CrCl cannot be calculated (Unknown ideal weight.). ------------------------------------------------------------------------------------------------------------------ No results for input(s): TSH, T4TOTAL, T3FREE, THYROIDAB in the last 72 hours.  Invalid input(s): FREET3   Coagulation profile No results for input(s): INR, PROTIME in the last 168 hours. ------------------------------------------------------------------------------------------------------------------- No results for input(s): DDIMER in the last 72 hours. -------------------------------------------------------------------------------------------------------------------  Cardiac Enzymes Recent Labs  Lab 03/08/18 1422  TROPONINI 0.19*   ------------------------------------------------------------------------------------------------------------------ Invalid input(s): POCBNP  ---------------------------------------------------------------------------------------------------------------  Urinalysis    Component Value Date/Time   COLORURINE YELLOW (A) 10/07/2017 1040   APPEARANCEUR CLEAR (A) 10/07/2017 1040   LABSPEC  1.009 10/07/2017 1040   PHURINE 5.0 10/07/2017 1040   GLUCOSEU 150 (A) 10/07/2017 1040   HGBUR MODERATE (A) 10/07/2017 1040   BILIRUBINUR NEGATIVE 10/07/2017 1040   KETONESUR NEGATIVE 10/07/2017 1040   PROTEINUR 100 (A) 10/07/2017 1040   NITRITE NEGATIVE 10/07/2017 1040   LEUKOCYTESUR NEGATIVE 10/07/2017 1040     RADIOLOGY: Dg Chest Port 1 View  Result Date: 03/08/2018 CLINICAL DATA:  Shortness of breath. EXAM: PORTABLE CHEST 1 VIEW COMPARISON:  None. FINDINGS: Heart size upper limits of normal. Aortic atherosclerosis. Acute interstitial and early alveolar edema pattern. No visible effusion. No consolidation or lobar collapse. No bone abnormality. IMPRESSION: Acute pulmonary edema. Primarily interstitial, with early alveolar edema. Electronically Signed   By: Paulina Fusi M.D.   On: 03/08/2018 14:59    EKG: Orders placed or performed during the hospital encounter of 03/08/18  . EKG 12-Lead  . EKG 12-Lead  . ED EKG 12-Lead  . ED EKG 12-Lead    IMPRESSION AND PLAN: *Acute on chronic hypoxic respiratory failure Secondary to acute on COPD exacerbation with right-sided heart failure Admit to stepdown unit, case discussed with intensivist, continue BiPAP with weaning as tolerated, repeat blood gas in 4 hours, supplemental oxygen wean as tolerated, rule out acute current syndrome with cardiac enzymes x3 sets, IV Solu-Medrol with tapering as tolerated, aggressive pulmonary toilet with bronchodilator therapy, inhaled corticosteroids twice daily, mucolytic agents, empiric Levaquin for 5 to 7-day course, check echocardiogram, continue close medical monitoring  *Acute congestive heart failure exacerbation Most likely exacerbated by COPD IV Lasix twice daily, strict I&O monitoring, daily weights, check echocardiogram, rule out acute current syndrome, aspirin, statin therapy, supplemental oxygen will wean as tolerated, check BNP  *Chronic diabetes mellitus type 2 Hold metformin, start sliding  scale insulin with Accu-Cheks per routine, check hemoglobin  A1c determine level of control  *Acute kidney injury Hold lisinopril, avoid nephrotoxic agents, IV fluids for rehydration, BMP in the morning, strict I&O monitoring with daily weights  *Chronic benign essential hypertension Continue Norvasc, hold lisinopril for acute kidney injury, hydralazine as needed systolic blood pressure greater than 160 necessary  *Chronic hyperlipidemia, unspecified Continue statin rx  All the records are reviewed and case discussed with ED provider. Management plans discussed with the patient, family and they are in agreement.  CODE STATUS:dnr Code Status History    Date Active Date Inactive Code Status Order ID Comments User Context   10/07/2017 1421 10/08/2017 1753 Full Code [id number]  Marguarite Arbour, MD Inpatient       TOTAL TIME TAKING CARE OF THIS PATIENT: 45 minutes.    Evelena Asa Sagrario Lineberry M.D on 03/08/2018   Between 7am to 6pm - Pager - [phone]  After 6pm go to [email or web address] - password [email or web address]  [facility]  Office  [phone]  CC: Primary care physician; Gracelyn Nurse, MD   Note: This dictation was prepared with Dragon dictation along with smaller phrase technology. Any transcriptional errors that result from this process are unintentional.

## 2018-03-08 NOTE — ED Notes (Signed)
Pt arrived to ED with wife who drove pt here. States COPD. Started having SOB last night and progressively got worse this AM. Pt arrives to room straight from being registered, pale in color. Diaphoretic to touch. Shallow breaths, tachypnic. Diminished lungs. Not able to talk. Can say one word. Falling asleep easily. Leaning over to L side. 48% RA, placed on 15 L nonrebreather. Came up to 87%. RT called. Pt placed on bipap and came up to 98%. Pt remains arousable to touch/sound but is still very sleepy. Doesn't wear oxygen at home. Didn't take any breathing treatments at home. Wife states she brought him because his HR was 117. Tachycardiac on monitor.

## 2018-03-08 NOTE — Progress Notes (Signed)
CODE SEPSIS - PHARMACY COMMUNICATION  **Broad Spectrum Antibiotics should be administered within 1 hour of Sepsis diagnosis**  Time Code Sepsis Called/Page Received: @1511   Antibiotics Ordered: Vancomycin                                      Cefepime                                     Azithromycin   Time of 1st antibiotic administration: @1602   Additional action taken by pharmacy: Contacted nurse at 1555 to make sure cefepime would administered before 1611  If necessary, Name of Provider/Nurse Contacted: Joseph PeacockShannon  Oretha Little Joseph Little, PharmD, BCPS Clinical Pharmacist 03/08/2018 4:10 PM

## 2018-03-08 NOTE — Consult Note (Signed)
   CHIEF COMPLAINT:   Chief Complaint  Patient presents with  . Respiratory Distress    Subjective  72 yo WM with ongoing tobacco abuse with acute and severe resp failure Found to be severely  hypoxic with increased WOB Placed on biPAP,, +wheezing Alert and awake +resp distress CXR with b/l interstitial infiltrates  High risk for intubation     Objective   Examination:  General exam: +resp distress Respiratory system: +wheezing HEENT: [place]/AT, PERRLA, no thrush, no stridor. Cardiovascular system: S1 & S2 heard, RRR. No JVD, murmurs, rubs, gallops or clicks. No pedal edema. Gastrointestinal system: Abdomen is nondistended, soft and nontender. No organomegaly or masses felt. Normal bowel sounds heard. Central nervous system: Alert and oriented. No focal neurological deficits. Extremities: Symmetric 5 x 5 power. Skin: No rashes, lesions or ulcers Psychiatry: Judgement and insight appear normal. Mood & affect appropriate.   VITALS:  weight is 180 lb (81.6 kg). His blood pressure is 131/56 (abnormal) and his pulse is 111 (abnormal). His respiration is 23 (abnormal) and oxygen saturation is 92%.   I personally reviewed Labs under Results section.      Assessment/Plan:  72 yo white male with acute and severe resp failure from acute COPD  Exacerbation with probable CHF(systolic/diastolic) failure with acute kidney injury  Severe Hypoxic and Hypercapnic Respiratory Failure biPAP as needed fio2 as needed NEB therapy IV steroids  ATYPICAL PNEUMONIA IV abx Check PRocalcitonin  ACUTE PULM EDEMA Lasix as tolerated Check ECHO   Critical Care Time devoted to patient care services described in this note is 46 minutes.   Overall, patient is critically ill, prognosis is guarded.  High risk for intubation.    Lucie LeatherKurian David Leyton Magoon, M.D.  [facility]  Medical Director [facility] Medical Director [facility]

## 2018-03-08 NOTE — ED Notes (Signed)
Pt is much more alert at this time. Speaking in complete sentences on the bipap. Visible shaking. Unable to obtain temperature. Wife states she took temp at 6111 and it was 97.1. Placed 2 warm blankets on pt at this time to warm pt up. Pt verbalized comfort.

## 2018-03-08 NOTE — ED Provider Notes (Addendum)
[facility] Emergency Department Provider Note ____________________________________________   First MD Initiated Contact with Patient 03/08/18 1418     (approximate)  I have reviewed the triage vital signs and the nursing notes.   HISTORY  Chief Complaint Respiratory Distress  HPI Joseph Little is a 72 y.o. male with a history of diabetes as well as hypertension and chronic tobacco use was presented to emergency department with 12 hours of worsening shortness of breath.  Mild cough this morning which is nonproductive.  No fever.  Patient was severe shortness of breath on arrival in triage and brought medially back to the treatment room where he was 48% on room air.  He is denying any chest pain at this time.   Past Medical History:  Diagnosis Date  . Diabetes mellitus without complication (HCC)   . Hypertension     Patient Active Problem List   Diagnosis Date Noted  . CVA (cerebrovascular accident) (HCC) 10/07/2017  . HTN (hypertension) 10/07/2017  . CKD (chronic kidney disease) stage 3, GFR 30-59 ml/min (HCC) 10/07/2017  . DM (diabetes mellitus) (HCC) 10/07/2017    Past Surgical History:  Procedure Laterality Date  . APPENDECTOMY    . FOOT SURGERY      Prior to Admission medications   Medication Sig Start Date End Date Taking? Authorizing Provider  amLODipine (NORVASC) 10 MG tablet Take 1 tablet (10 mg total) by mouth daily. 10/08/17 10/08/18  Adrian Saran, MD  aspirin EC 325 MG tablet Take 1 tablet (325 mg total) by mouth daily. 10/08/17 10/08/18  Adrian Saran, MD  atorvastatin (LIPITOR) 40 MG tablet Take 1 tablet (40 mg total) by mouth daily at 6 PM. 10/08/17   Adrian Saran, MD  glipiZIDE (GLUCOTROL) 5 MG tablet Take 1 tablet (5 mg total) by mouth 2 (two) times daily. 10/08/17 10/08/18  Adrian Saran, MD  metFORMIN (GLUCOPHAGE) 1000 MG tablet Take 1 tablet (1,000 mg total) by mouth 2 (two) times daily with a meal. 10/08/17 11/07/17  Adrian Saran, MD     Allergies Patient has no known allergies.  No family history on file.  Social History Social History   Tobacco Use  . Smoking status: Current Every Day Smoker  . Smokeless tobacco: Never Used  Substance Use Topics  . Alcohol use: No    Frequency: Never  . Drug use: No    Review of Systems  Constitutional: No fever/chills Eyes: No visual changes. ENT: No sore throat. Cardiovascular: Denies chest pain. Respiratory: As above Gastrointestinal: No abdominal pain.  No nausea, no vomiting.  No diarrhea.  No constipation. Genitourinary: Negative for dysuria. Musculoskeletal: Negative for back pain. Skin: Negative for rash. Neurological: Negative for headaches, focal weakness or numbness.   ____________________________________________   PHYSICAL EXAM:  VITAL SIGNS: ED Triage Vitals  Enc Vitals Group     BP 03/08/18 1426 (!) 170/71     Pulse Rate 03/08/18 1426 (!) 115     Resp 03/08/18 1426 (!) 48     Temp --      Temp src --      SpO2 03/08/18 1426 (!) 54 %     Weight --      Height --      Head Circumference --      Peak Flow --      Pain Score 03/08/18 1427 0     Pain Loc --      Pain Edu? --      Excl. in GC? --  Constitutional: Patient appears sleepy but is easily awoken and fully oriented.  Pale in color. Eyes: Conjunctivae are normal.  Head: Atraumatic. Nose: No congestion/rhinnorhea. Mouth/Throat: Mucous membranes are moist.  Neck: No stridor.   Cardiovascular: Tachycardic, regular rhythm. Grossly normal heart sounds.  Respiratory: Labored respirations with use of accessory muscles.  Coarse wheezes/rhonchi throughout all fields.  Prolonged extra Tory phase. Gastrointestinal: Soft and nontender. No distention. No CVA tenderness. Musculoskeletal: Mild edema to the bilateral lower extremity's. Neurologic:  Normal speech and language. No gross focal neurologic deficits are appreciated. Skin:  Skin is warm, dry and intact. No rash  noted. Psychiatric: Mood and affect are normal. Speech and behavior are normal.  ____________________________________________   LABS (all labs ordered are listed, but only abnormal results are displayed)  Labs Reviewed  BLOOD GAS, VENOUS - Abnormal; Notable for the following components:      Result Value   Bicarbonate 16.4 (*)    Acid-base deficit 13.4 (*)    All other components within normal limits  TROPONIN I - Abnormal; Notable for the following components:   Troponin I 0.19 (*)    All other components within normal limits  CBC WITH DIFFERENTIAL/PLATELET - Abnormal; Notable for the following components:   WBC 23.0 (*)    RBC 3.76 (*)    Hemoglobin 11.9 (*)    HCT 36.8 (*)    Neutro Abs 17.2 (*)    Lymphs Abs 4.6 (*)    Basophils Absolute 0.2 (*)    All other components within normal limits  BASIC METABOLIC PANEL - Abnormal; Notable for the following components:   CO2 14 (*)    Glucose, Bld 282 (*)    BUN 38 (*)    Creatinine, Ser 1.81 (*)    Calcium 8.7 (*)    GFR calc non Af Amer 36 (*)    GFR calc Af Amer 42 (*)    Anion gap 16 (*)    All other components within normal limits  GLUCOSE, CAPILLARY - Abnormal; Notable for the following components:   Glucose-Capillary 263 (*)    All other components within normal limits  CULTURE, BLOOD (ROUTINE X 2)  CULTURE, BLOOD (ROUTINE X 2)  URINALYSIS, ROUTINE W REFLEX MICROSCOPIC  LACTIC ACID, PLASMA  LACTIC ACID, PLASMA  BRAIN NATRIURETIC PEPTIDE   ____________________________________________  EKG  ED ECG REPORT I, Arelia Longest, the attending physician, personally viewed and interpreted this ECG.   Date: 03/08/2018  EKG Time: 1422  Rate: 128  Rhythm: sinus tachycardia with multiple PVCs  Axis: Normal  Intervals:Borderline widened QRS.  ST&T Change: Diffuse ST depressions, likely demand related.  Minimal ST elevation in aVR as well as V2 and V3.  However, the EKG of October 07, 2017 is similar in  appearance.  ____________________________________________  RADIOLOGY  CHF. ____________________________________________   PROCEDURES  Procedure(s) performed:   .Critical Care Performed by: Myrna Blazer, MD Authorized by: Myrna Blazer, MD   Critical care provider statement:    Critical care time (minutes):  35   Critical care time was exclusive of:  Separately billable procedures and treating other patients   Critical care was necessary to treat or prevent imminent or life-threatening deterioration of the following conditions:  Respiratory failure   Critical care was time spent personally by me on the following activities:  Development of treatment plan with patient or surrogate, discussions with consultants, evaluation of patient's response to treatment, examination of patient, obtaining history from patient or surrogate, ordering and  performing treatments and interventions, ordering and review of laboratory studies, ordering and review of radiographic studies, pulse oximetry, re-evaluation of patient's condition and review of old charts    Critical Care performed:   ____________________________________________   INITIAL IMPRESSION / ASSESSMENT AND PLAN / ED COURSE  Pertinent labs & imaging results that were available during my care of the patient were reviewed by me and considered in my medical decision making (see chart for details).  Differential includes, but is not limited to, viral syndrome, bronchitis including COPD exacerbation, pneumonia, reactive airway disease including asthma, CHF including exacerbation with or without pulmonary/interstitial edema, pneumothorax, ACS, thoracic trauma, and pulmonary embolism. As part of my medical decision making, I reviewed the following data within the electronic MEDICAL RECORD NUMBER Notes from prior ED visits  ----------------------------------------- 3:22 PM on  03/08/2018 -----------------------------------------  Patient tolerating the BiPAP well and now has improved mental status.  He is awake and alert.  Increased white count to 23.  CHF on chest x-ray.  Made sepsis alert.  To give antibiotics.  Also given duo nebs, methylprednisolone and magnesium.  Patient will require ICU admission.  Signed out to Dr. Katheren ShamsSalary.  Oxygen saturation now at 90 to 92% on the BiPAP.  Elevated troponin which I suspect is secondary to demand.  It will require trending. ____________________________________________   FINAL CLINICAL IMPRESSION(S) / ED DIAGNOSES  Final diagnoses:  SOB (shortness of breath)  COPD exacerbation.  CHF.  Hypoxia.    NEW MEDICATIONS STARTED DURING THIS VISIT:  New Prescriptions   No medications on file     Note:  This document was prepared using Dragon voice recognition software and may include unintentional dictation errors.     Myrna BlazerSchaevitz, Mildred Bollard Matthew, MD 03/08/18 1523    Pershing ProudSchaevitz, Myra Rudeavid Matthew, MD 03/08/18 1524

## 2018-03-08 NOTE — ED Triage Notes (Signed)
Pt comes into the ED via POV with his wife c/o shortness of breath that started last night.  Patient has COPD.  No neb treatments or inhalers at home.  Patient turning cyanotic and has limited movement of air.  Patient in tripod position at this time.

## 2018-03-08 NOTE — Progress Notes (Signed)
Family Meeting Note  Advance Directive:yes  Today a meeting took place with the Patient.  Patient is able to participate   The following clinical team members were present during this meeting:MD  The following were discussed:Patient's diagnosis: Respiratory failure, Patient's progosis: Unable to determine and Goals for treatment: DNR  Additional follow-up to be provided: prn  Time spent during discussion:20 minutes  Bertrum SolMontell D Mccoy Testa, MD

## 2018-03-08 NOTE — ED Notes (Signed)
This RN to transport pt with RT to ICU-12. Floor aware we are en route.

## 2018-03-09 ENCOUNTER — Inpatient Hospital Stay
Admit: 2018-03-09 | Discharge: 2018-03-09 | Disposition: A | Discharge status: Home / Self Care | Payer: Medicare Other | Attending: Family Medicine | Admitting: Family Medicine

## 2018-03-09 ENCOUNTER — Inpatient Hospital Stay: Payer: Medicare Other

## 2018-03-09 LAB — BASIC METABOLIC PANEL
ANION GAP: 8 (ref 5–15)
BUN: 59 mg/dL — ABNORMAL HIGH (ref 8–23)
CALCIUM: 8.9 mg/dL (ref 8.9–10.3)
CO2: 21 mmol/L — ABNORMAL LOW (ref 22–32)
Chloride: 110 mmol/L (ref 98–111)
Creatinine, Ser: 2.38 mg/dL — ABNORMAL HIGH (ref 0.61–1.24)
GFR, EST AFRICAN AMERICAN: 30 mL/min — AB (ref >60)
GFR, EST NON AFRICAN AMERICAN: 26 mL/min — AB (ref >60)
Glucose, Bld: 200 mg/dL — ABNORMAL HIGH (ref 70–99)
POTASSIUM: 4.1 mmol/L (ref 3.5–5.1)
SODIUM: 139 mmol/L (ref 135–145)

## 2018-03-09 LAB — PROTIME-INR
INR: 1.05
Prothrombin Time: 13.6 seconds (ref 11.4–15.2)

## 2018-03-09 LAB — GLUCOSE, CAPILLARY
GLUCOSE-CAPILLARY: 400 mg/dL — AB (ref 70–99)
GLUCOSE-CAPILLARY: 356 mg/dL — AB (ref 70–99)
GLUCOSE-CAPILLARY: 111 mg/dL — AB (ref 70–99)
Glucose-Capillary: 242 mg/dL — ABNORMAL HIGH (ref 70–99)
Glucose-Capillary: 233 mg/dL — ABNORMAL HIGH (ref 70–99)
Glucose-Capillary: 147 mg/dL — ABNORMAL HIGH (ref 70–99)
Glucose-Capillary: 163 mg/dL — ABNORMAL HIGH (ref 70–99)

## 2018-03-09 LAB — PROCALCITONIN: Procalcitonin: 4.18 ng/mL

## 2018-03-09 LAB — CBC
HEMATOCRIT: 30.9 % — AB (ref 40.0–52.0)
HEMOGLOBIN: 10.6 g/dL — AB (ref 13.0–18.0)
MCH: 32.3 pg (ref 26.0–34.0)
MCHC: 34.3 g/dL (ref 32.0–36.0)
MCV: 94.2 fL (ref 80.0–100.0)
Platelets: 144 10*3/uL — ABNORMAL LOW (ref 150–440)
RBC: 3.28 MIL/uL — ABNORMAL LOW (ref 4.40–5.90)
RDW: 14 % (ref 11.5–14.5)
WBC: 13.3 10*3/uL — AB (ref 3.8–10.6)

## 2018-03-09 LAB — LACTIC ACID, PLASMA
Lactic Acid, Venous: 2 mmol/L (ref 0.5–1.9)
Lactic Acid, Venous: 1.7 mmol/L (ref 0.5–1.9)

## 2018-03-09 LAB — ECHOCARDIOGRAM COMPLETE
HEIGHTINCHES: 73 in
WEIGHTICAEL: 2920.65 [oz_av]

## 2018-03-09 LAB — HIV ANTIBODY (ROUTINE TESTING W REFLEX): HIV Screen 4th Generation wRfx: NONREACTIVE

## 2018-03-09 MED ORDER — SODIUM CHLORIDE 0.9 % IV BOLUS
250.0000 mL | Freq: Once | INTRAVENOUS | Status: AC
  Administered 2018-03-09: 250 mL via INTRAVENOUS

## 2018-03-09 MED ORDER — NOREPINEPHRINE 4 MG/250ML-% IV SOLN
0.0000 ug/min | INTRAVENOUS | Status: DC
  Administered 2018-03-09 – 2018-03-10 (×3): 5 ug/min via INTRAVENOUS
  Filled 2018-03-09 (×3): qty 250

## 2018-03-09 MED ORDER — TAMSULOSIN HCL 0.4 MG PO CAPS
0.4000 mg | ORAL_CAPSULE | Freq: Every day | ORAL | Status: DC
  Administered 2018-03-09 – 2018-03-14 (×6): 0.4 mg via ORAL
  Filled 2018-03-09 (×6): qty 1

## 2018-03-09 MED ORDER — SODIUM CHLORIDE 0.9 % IV SOLN: 1.0000 g | Freq: Two times a day (BID) | INTRAVENOUS | Status: DC

## 2018-03-09 MED ORDER — FAMOTIDINE 20 MG PO TABS
20.0000 mg | ORAL_TABLET | Freq: Every day | ORAL | Status: DC
  Administered 2018-03-11 – 2018-03-14 (×4): 20 mg via ORAL
  Filled 2018-03-09 (×4): qty 1

## 2018-03-09 MED ORDER — SODIUM CHLORIDE 0.9 % IV SOLN
1.0000 g | INTRAVENOUS | Status: DC
  Administered 2018-03-10 – 2018-03-14 (×5): 1 g via INTRAVENOUS
  Filled 2018-03-09 (×5): qty 1

## 2018-03-09 MED ORDER — DEXMEDETOMIDINE HCL IN NACL 400 MCG/100ML IV SOLN
0.0000 ug/kg/h | INTRAVENOUS | Status: DC
  Administered 2018-03-09: 0.4 ug/kg/h via INTRAVENOUS
  Administered 2018-03-10: 0.2 ug/kg/h via INTRAVENOUS
  Administered 2018-03-10: 0.7 ug/kg/h via INTRAVENOUS
  Administered 2018-03-10 (×2): 1 ug/kg/h via INTRAVENOUS
  Administered 2018-03-10: 1.5 ug/kg/h via INTRAVENOUS
  Filled 2018-03-09 (×7): qty 100

## 2018-03-09 MED ORDER — SODIUM CHLORIDE 0.9 % IV SOLN
1.0000 g | INTRAVENOUS | Status: DC
  Administered 2018-03-09: 1 g via INTRAVENOUS
  Filled 2018-03-09: qty 1

## 2018-03-09 NOTE — Progress Notes (Signed)
Follow up - Critical Care Medicine Note  Patient Details:    Joseph Little is an 72 y.o. male.with history of CVA, hypertension, and diabetes who presents to the ED for evaluation of shortness of breath with associated non-productive cough. He has smoked 1 PPD x 40 years. On arrival to the ED patient was noted to have an O2 sat of 48% on room air with cyanosis. He was placed on 15L non-rebreather with improvement in 87%. BiPAP was started Patient given duonebs, methylprednisolone, magnesium, and antibiotics in ICU. Code sepsis called.   Lines, Airways, Drains: Urethral Catheter Tess (Active)  Indication for Insertion or Continuance of Catheter Aggressive IV diuresis 03/09/2018  3:47 AM  Site Assessment Clean;Intact 03/09/2018  3:47 AM  Catheter Maintenance Bag below level of bladder;Catheter secured;Drainage bag/tubing not touching floor;Insertion date on drainage bag;No dependent loops;Seal intact 03/09/2018  3:47 AM  Collection Container Standard drainage bag 03/09/2018  3:47 AM  Securement Method Securing device (Describe) 03/09/2018  3:47 AM  Urinary Catheter Interventions Unclamped 03/09/2018  3:47 AM  Output (mL) 250 mL 03/09/2018  1:15 AM    Anti-infectives:  Anti-infectives (From admission, onward)   Start     Dose/Rate Route Frequency Ordered Stop   03/09/18 1800  azithromycin (ZITHROMAX) 500 mg in sodium chloride 0.9 % 250 mL IVPB     500 mg 250 mL/hr over 60 Minutes Intravenous Every 24 hours 03/08/18 1942 03/13/18 1759   03/08/18 1715  levofloxacin (LEVAQUIN) IVPB 500 mg  Status:  Discontinued     500 mg 100 mL/hr over 60 Minutes Intravenous Every 24 hours 03/08/18 1711 03/08/18 1720   03/08/18 1515  ceFEPIme (MAXIPIME) 2 g in sodium chloride 0.9 % 100 mL IVPB     2 g 200 mL/hr over 30 Minutes Intravenous  Once 03/08/18 1507 03/08/18 1635   03/08/18 1515  vancomycin (VANCOCIN) IVPB 1000 mg/200 mL premix     1,000 mg 200 mL/hr over 60 Minutes Intravenous  Once 03/08/18 1507 03/08/18 1845    03/08/18 1445  azithromycin (ZITHROMAX) 500 mg in sodium chloride 0.9 % 250 mL IVPB     500 mg 250 mL/hr over 60 Minutes Intravenous  Once 03/08/18 1430 03/08/18 1601      Microbiology: Results for orders placed or performed during the hospital encounter of 03/08/18  Blood Culture (routine x 2)     Status: None (Preliminary result)   Collection Time: 03/08/18  4:03 PM  Result Value Ref Range Status   Specimen Description BLOOD RIGHT ANTECUBITAL  Final   Special Requests   Final    BOTTLES DRAWN AEROBIC AND ANAEROBIC Blood Culture adequate volume   Culture   Final    NO GROWTH < 24 HOURS Performed at [facility], [address] [place], [place] [address]    Report Status PENDING  Incomplete  Blood Culture (routine x 2)     Status: None (Preliminary result)   Collection Time: 03/08/18  4:03 PM  Result Value Ref Range Status   Specimen Description BLOOD BLOOD LEFT HAND  Final   Special Requests   Final    BOTTLES DRAWN AEROBIC AND ANAEROBIC Blood Culture adequate volume   Culture   Final    NO GROWTH < 24 HOURS Performed at [facility], [address] [place], [place] [address]    Report Status PENDING  Incomplete   Studies: Dg Chest Port 1 View  Result Date: 03/08/2018 CLINICAL DATA:  Shortness of breath. EXAM: PORTABLE CHEST 1  VIEW COMPARISON:  None. FINDINGS: Heart size upper limits of normal. Aortic atherosclerosis. Acute interstitial and early alveolar edema pattern. No visible effusion. No consolidation or lobar collapse. No bone abnormality. IMPRESSION: Acute pulmonary edema. Primarily interstitial, with early alveolar edema. Electronically Signed   By: Paulina Fusi M.D.   On: 03/08/2018 14:59    Consults:    Subjective:    Overnight Issues: Patient is presently resting comfortably on BiPAP. When he takes the mask off to speak his oxygen saturations remained stable  Objective:  Vital signs for last 24 hours: Temp:  [97.6 F (36.4 C)-98.6 F  (37 C)] 98.6 F (37 C) (08/02 0500) Pulse Rate:  [94-129] 104 (08/02 0600) Resp:  [19-48] 21 (08/02 0600) BP: (97-170)/(48-136) 104/54 (08/02 0600) SpO2:  [54 %-100 %] 99 % (08/02 0600) FiO2 (%):  [40 %-55 %] 55 % (08/02 0008) Weight:  [180 lb (81.6 kg)-182 lb 8.7 oz (82.8 kg)] 182 lb 8.7 oz (82.8 kg) (08/01 1800)  Hemodynamic parameters for last 24 hours:    Intake/Output from previous day: 08/01 0701 - 08/02 0700 In: 400 [IV Piggyback:400] Out: 700 [Urine:700]  Intake/Output this shift: No intake/output data recorded.  Vent settings for last 24 hours: FiO2 (%):  [40 %-55 %] 55 %  Physical Exam:  Constitutional: He is oriented to person, place, and time.  Resting on BiPAP  HENT:  Head: Normocephalic and atraumatic.  Eyes: Pupils are equal, round, and reactive to light.  Cardiovascular:  Tachycardic, regular rhythm. Normal S1 and S2. No murmurs, rubs, gallops. Dorsalis pedis pulses 1+ bilaterally.   Pulmonary/Chest:  Bilateral expiratory wheezing heard anteriorly. No rales, rhonchi.   Abdominal:  Soft, non-distended abdomen. No tenderness to palpation. Positive bowel sounds.   Musculoskeletal: Normal range of motion. He exhibits no edema.  Neurological: He is alert and oriented to person, place, and time. No cranial nerve deficit.       Assessment/Plan:   Respiratory failure. Most likely secondary to COPD with superimposed pulmonary edema. Patient is presently on BiPAP, will wean as tolerated, continue albuterol, Atrovent, budesonide, Solu-Medrol, patient is presently on azithromycin, will add Rocephin, diuresis as tolerated  Hyperglycemia. On sliding scale coverage  Leukocytosis. On broad-spectrum antibiotic coverage, has decreased  Anemia. No evidence of active bleeding  Renal insufficiency.worsening renal function. Will support hemodynamics, avoid nephrotoxic agents, will obtain renal ultrasound along with bladder scan  Lactic acidosis. support hemodynamics,  follow-up lactic acid level  Positive troponin. We'll cycle troponins, EKG reveals atrial fibrillation with nonspecific interventricular conduction delay with repolarization abnormalities. Will check echocardiography   Critical Care Total Time. 40 Minutes  Bonnie Roig,Phinehas 03/09/2018  *Care during the described time interval was provided by me and/or other providers on the critical care team.  I have reviewed this patient's available data, including medical history, events of note, physical examination and test results as part of my evaluation.

## 2018-03-09 NOTE — Progress Notes (Signed)
[employer] [facility] [place] [facility]   PATIENT NAME: Joseph Little    MR#:  [id number]  DATE OF BIRTH:  [birth year]  SUBJECTIVE:  CHIEF COMPLAINT:   Chief Complaint  Patient presents with  . Respiratory Distress  Patient seen and evaluated this morning Patient has been weaned of BiPAP Currently on oxygen via nasal cannula at 6 L Is a chronic tobacco user  REVIEW OF SYSTEMS:    ROS  CONSTITUTIONAL: No documented fever. Has fatigue, weakness. No weight gain, no weight loss.  EYES: No blurry or double vision.  ENT: No tinnitus. No postnasal drip. No redness of the oropharynx.  RESPIRATORY: Decreased cough, wheeze, no hemoptysis. Has dyspnea.  CARDIOVASCULAR: No chest pain. No orthopnea. No palpitations. No syncope.  GASTROINTESTINAL: No nausea, no vomiting or diarrhea. No abdominal pain. No melena or hematochezia.  GENITOURINARY: No dysuria or hematuria.  ENDOCRINE: No polyuria or nocturia. No heat or cold intolerance.  HEMATOLOGY: No anemia. No bruising. No bleeding.  INTEGUMENTARY: No rashes. No lesions.  MUSCULOSKELETAL: No arthritis. No swelling. No gout.  NEUROLOGIC: No numbness, tingling, or ataxia. No seizure-type activity.  PSYCHIATRIC: No anxiety. No insomnia. No ADD.   DRUG ALLERGIES:  No Known Allergies  VITALS:  Blood pressure (!) 113/58, pulse (!) 110, temperature 98.9 F (37.2 C), temperature source Oral, resp. rate (!) 29, height 6\' 1"  (1.854 m), weight 182 lb 8.7 oz (82.8 kg), SpO2 (!) 89 %.  PHYSICAL EXAMINATION:   Physical Exam  GENERAL:  72 y.o.-year-old patient lying in the bed with no acute distress.  EYES: Pupils equal, round, reactive to light and accommodation. No scleral icterus. Extraocular muscles intact.  HEENT: Head atraumatic, normocephalic. Oropharynx and nasopharynx clear.  NECK:  Supple, no jugular venous distention. No thyroid enlargement, no tenderness.  LUNGS: Decreased breath sounds bilaterally, bilateral wheezing,  rales. No use of accessory muscles of respiration.  CARDIOVASCULAR: S1, S2 normal. No murmurs, rubs, or gallops.  ABDOMEN: Soft, nontender, nondistended. Bowel sounds present. No organomegaly or mass.  EXTREMITIES: No cyanosis, clubbing or edema b/l.    NEUROLOGIC: Cranial nerves II through XII are intact. No focal Motor or sensory deficits b/l.   PSYCHIATRIC: The patient is alert and oriented x 3.  SKIN: No obvious rash, lesion, or ulcer.   LABORATORY PANEL:   CBC Recent Labs  Lab 03/09/18 0533  WBC 13.3*  HGB 10.6*  HCT 30.9*  PLT 144*   ------------------------------------------------------------------------------------------------------------------ Chemistries  Recent Labs  Lab 03/09/18 0533  NA 139  K 4.1  CL 110  CO2 21*  GLUCOSE 200*  BUN 59*  CREATININE 2.38*  CALCIUM 8.9   ------------------------------------------------------------------------------------------------------------------  Cardiac Enzymes Recent Labs  Lab 03/08/18 1422  TROPONINI 0.19*   ------------------------------------------------------------------------------------------------------------------  RADIOLOGY:  US Renal  Result Date: 03/09/2018 CLINICAL DATA:  Acute renal failure. EXAM: RENAL / URINARY TRACT ULTRASOUND COMPLETE COMPARISON:  None. FINDINGS: Right Kidney: Length: 11.5 cm. Echogenicity within normal limits. No mass or hydronephrosis visualized. Left Kidney: Length: 11.4 cm. 1.6 cm exophytic simple cyst is seen arising from upper pole. Echogenicity within normal limits. No mass or hydronephrosis visualized. Bladder: Decompressed secondary to Foley catheter. IMPRESSION: No significant renal abnormality seen. Electronically Signed   By: Lupita Raider, M.Little.   On: 03/09/2018 09:41   Dg Chest Port 1 View  Result Date: 03/08/2018 CLINICAL DATA:  Shortness of breath. EXAM: PORTABLE CHEST 1 VIEW COMPARISON:  None. FINDINGS: Heart size upper limits of normal. Aortic atherosclerosis. Acute  interstitial and  early alveolar edema pattern. No visible effusion. No consolidation or lobar collapse. No bone abnormality. IMPRESSION: Acute pulmonary edema. Primarily interstitial, with early alveolar edema. Electronically Signed   By: Paulina FusiMark  Shogry M.Little.   On: 03/08/2018 14:59     ASSESSMENT AND PLAN:  72 year old male patient with history of hypertension, CVA, diabetes mellitus, tobacco abuse currently in ICU for respiratory failure.  -Acute hypoxic respiratory failure Weaned of BiPAP On oxygen via nasal cannula at 6 L  -Acute pulmonary edema On Lasix for diuresis as tolerated  check echocardiogram to assess LV function  -New onset COPD with exacerbation continue IV Solu-Medrol and nebulization treatments Empirical IV Rocephin and Zithromax antibiotics  -Acute kidney injury Avoid nephrotoxic drugs Renal ultrasound normal Monitor renal function  -Tobacco abuse Tobacco cessation counseled to the patient for 6 minutes Nicotine patch offered  -Elevated troponin secondary to demand ischemia  All the records are reviewed and case discussed with Care Management/Social Worker. Management plans discussed with the patient, family and they are in agreement.  CODE STATUS: DNR  DVT Prophylaxis: SCDs  TOTAL TIME TAKING CARE OF THIS PATIENT: 35 minutes.   POSSIBLE Little/C IN 2 to 3 DAYS, DEPENDING ON CLINICAL CONDITION.  Ihor AustinPavan Emilo Gras M.Little on 03/09/2018 at 10:51 AM  Between 7am to 6pm - Pager - [phone]  After 6pm go to [email or web address] - password [email or web address] [facility]  [facility] [place] [facility]  Office  [phone]  CC: Primary care physician; Joseph Little, Joseph Little, Joseph Little  Note: This dictation was prepared with Dragon dictation along with smaller phrase technology. Any transcriptional errors that result from this process are unintentional.

## 2018-03-09 NOTE — Progress Notes (Signed)
Pt taken off bipap, placed on 6lpm [place], sats 94%, tolerating well at this time, RN notified, will continued to monitor

## 2018-03-09 NOTE — Progress Notes (Signed)
PHARMACIST - PHYSICIAN COMMUNICATION  CONCERNING: IV to Oral Route Change Policy  RECOMMENDATION: This patient is receiving famotidne by the intravenous route.  Based on criteria approved by the Pharmacy and Therapeutics Committee, the intravenous medication(s) is/are being converted to the equivalent oral dose form(s).   DESCRIPTION: These criteria include:  The patient is eating (either orally or via tube) and/or has been taking other orally administered medications for a least 24 hours  The patient has no evidence of active gastrointestinal bleeding or impaired GI absorption (gastrectomy, short bowel, patient on TNA or NPO).  If you have questions about this conversion, please contact the Pharmacy Department  []   [phone]( [phone] )  [facility] [x]   [phone]( [phone] )  [facility] []   [phone]( [phone]  [facility] []   [phone]( [phone]  [facility] []   [phone]( [phone] )  [facility]   Chanc Kervin L, [facility] Pa[facility]PH 03/09/2018 10:24 AM

## 2018-03-09 NOTE — Progress Notes (Signed)
*  PRELIMINARY RESULTS* Echocardiogram 2D Echocardiogram has been performed.  Joseph GulaJoan M Arye Little 03/09/2018, 11:38 AM

## 2018-03-10 LAB — BASIC METABOLIC PANEL
Anion gap: 7 (ref 5–15)
BUN: 80 mg/dL — ABNORMAL HIGH (ref 8–23)
CALCIUM: 8.7 mg/dL — AB (ref 8.9–10.3)
CO2: 21 mmol/L — AB (ref 22–32)
CREATININE: 2.78 mg/dL — AB (ref 0.61–1.24)
Chloride: 109 mmol/L (ref 98–111)
GFR calc Af Amer: 25 mL/min — ABNORMAL LOW (ref >60)
GFR calc non Af Amer: 22 mL/min — ABNORMAL LOW (ref >60)
GLUCOSE: 210 mg/dL — AB (ref 70–99)
Potassium: 5.2 mmol/L — ABNORMAL HIGH (ref 3.5–5.1)
Sodium: 137 mmol/L (ref 135–145)

## 2018-03-10 LAB — CBC WITH DIFFERENTIAL/PLATELET
BASOS PCT: 0 %
Basophils Absolute: 0.1 10*3/uL (ref 0–0.1)
Eosinophils Absolute: 0 10*3/uL (ref 0–0.7)
Eosinophils Relative: 0 %
HEMATOCRIT: 30.6 % — AB (ref 40.0–52.0)
Hemoglobin: 10.6 g/dL — ABNORMAL LOW (ref 13.0–18.0)
Lymphocytes Relative: 9 %
Lymphs Abs: 2.7 10*3/uL (ref 1.0–3.6)
MCH: 32.3 pg (ref 26.0–34.0)
MCHC: 34.6 g/dL (ref 32.0–36.0)
MCV: 93.3 fL (ref 80.0–100.0)
MONO ABS: 1.3 10*3/uL — AB (ref 0.2–1.0)
MONOS PCT: 4 %
NEUTROS ABS: 27.2 10*3/uL — AB (ref 1.4–6.5)
Neutrophils Relative %: 87 %
Platelets: 179 10*3/uL (ref 150–440)
RBC: 3.28 MIL/uL — ABNORMAL LOW (ref 4.40–5.90)
RDW: 14.1 % (ref 11.5–14.5)
WBC: 31.3 10*3/uL — ABNORMAL HIGH (ref 3.8–10.6)

## 2018-03-10 LAB — URINALYSIS, COMPLETE (UACMP) WITH MICROSCOPIC
BACTERIA UA: NONE SEEN
Specific Gravity, Urine: 1.027 (ref 1.005–1.030)

## 2018-03-10 LAB — GLUCOSE, CAPILLARY
GLUCOSE-CAPILLARY: 171 mg/dL — AB (ref 70–99)
Glucose-Capillary: 134 mg/dL — ABNORMAL HIGH (ref 70–99)
Glucose-Capillary: 137 mg/dL — ABNORMAL HIGH (ref 70–99)
Glucose-Capillary: 242 mg/dL — ABNORMAL HIGH (ref 70–99)
Glucose-Capillary: 255 mg/dL — ABNORMAL HIGH (ref 70–99)
Glucose-Capillary: 277 mg/dL — ABNORMAL HIGH (ref 70–99)
Glucose-Capillary: 288 mg/dL — ABNORMAL HIGH (ref 70–99)

## 2018-03-10 LAB — PROCALCITONIN: Procalcitonin: 4.12 ng/mL

## 2018-03-10 MED ORDER — GLIPIZIDE 5 MG PO TABS
5.0000 mg | ORAL_TABLET | Freq: Two times a day (BID) | ORAL | Status: DC
  Administered 2018-03-10 – 2018-03-11 (×2): 5 mg via ORAL
  Filled 2018-03-10 (×3): qty 1

## 2018-03-10 NOTE — Progress Notes (Signed)
[employer] [facility] - [place] at [facility]   PATIENT NAME: Joseph RipaJohn Little    MR#:  [id number]  DATE OF BIRTH:  [birth year]  SUBJECTIVE:  CHIEF COMPLAINT:   Chief Complaint  Patient presents with  . Respiratory Distress  Remains on BiPAP REVIEW OF SYSTEMS:    ROS  CONSTITUTIONAL: No documented fever. Has fatigue, weakness. No weight gain, no weight loss.  EYES: No blurry or double vision.  ENT: No tinnitus. No postnasal drip. No redness of the oropharynx.  RESPIRATORY: Decreased cough, wheeze, no hemoptysis. Has dyspnea.  CARDIOVASCULAR: No chest pain. No orthopnea. No palpitations. No syncope.  GASTROINTESTINAL: No nausea, no vomiting or diarrhea. No abdominal pain. No melena or hematochezia.  GENITOURINARY: No dysuria or hematuria.  ENDOCRINE: No polyuria or nocturia. No heat or cold intolerance.  HEMATOLOGY: No anemia. No bruising. No bleeding.  INTEGUMENTARY: No rashes. No lesions.  MUSCULOSKELETAL: No arthritis. No swelling. No gout.  NEUROLOGIC: No numbness, tingling, or ataxia. No seizure-type activity.  PSYCHIATRIC: No anxiety. No insomnia. No ADD.  DRUG ALLERGIES:  No Known Allergies  VITALS:  Blood pressure 122/61, pulse (!) 57, temperature (!) 97.3 F (36.3 C), temperature source Axillary, resp. rate 15, height 6\' 1"  (1.854 m), weight 82.3 kg (181 lb 7 oz), SpO2 96 %. PHYSICAL EXAMINATION:   Physical Exam  GENERAL:  72 y.o.-year-old patient lying in the bed with no acute distress.  EYES: Pupils equal, round, reactive to light and accommodation. No scleral icterus. Extraocular muscles intact.  HEENT: Head atraumatic, normocephalic. Oropharynx and nasopharynx clear.  NECK:  Supple, no jugular venous distention. No thyroid enlargement, no tenderness.  LUNGS: Decreased breath sounds bilaterally, bilateral wheezing, rales. No use of accessory muscles of respiration.  CARDIOVASCULAR: S1, S2 normal. No murmurs, rubs, or gallops.  ABDOMEN: Soft, nontender,  nondistended. Bowel sounds present. No organomegaly or mass.  EXTREMITIES: No cyanosis, clubbing or edema b/l.    NEUROLOGIC: Cranial nerves II through XII are intact. No focal Motor or sensory deficits b/l.   PSYCHIATRIC: The patient is alert and oriented x 3.  SKIN: No obvious rash, lesion, or ulcer.  LABORATORY PANEL:   CBC Recent Labs  Lab 03/10/18 0634  WBC 31.3*  HGB 10.6*  HCT 30.6*  PLT 179   ------------------------------------------------------------------------------------------------------------------ Chemistries  Recent Labs  Lab 03/10/18 0634  NA 137  K 5.2*  CL 109  CO2 21*  GLUCOSE 210*  BUN 80*  CREATININE 2.78*  CALCIUM 8.7*   ------------------------------------------------------------------------------------------------------------------  Cardiac Enzymes Recent Labs  Lab 03/08/18 1422  TROPONINI 0.19*   ------------------------------------------------------------------------------------------------------------------  RADIOLOGY:  [place] Renal  Result Date: 03/09/2018 CLINICAL DATA:  Acute renal failure. EXAM: RENAL / URINARY TRACT ULTRASOUND COMPLETE COMPARISON:  None. FINDINGS: Right Kidney: Length: 11.5 cm. Echogenicity within normal limits. No mass or hydronephrosis visualized. Left Kidney: Length: 11.4 cm. 1.6 cm exophytic simple cyst is seen arising from upper pole. Echogenicity within normal limits. No mass or hydronephrosis visualized. Bladder: Decompressed secondary to Foley catheter. IMPRESSION: No significant renal abnormality seen. Electronically Signed   By: Lupita RaiderJames  Green Little, M.D.   On: 03/09/2018 09:41   Dg Chest Port 1 View  Result Date: 03/08/2018 CLINICAL DATA:  Shortness of breath. EXAM: PORTABLE CHEST 1 VIEW COMPARISON:  None. FINDINGS: Heart size upper limits of normal. Aortic atherosclerosis. Acute interstitial and early alveolar edema pattern. No visible effusion. No consolidation or lobar collapse. No bone abnormality. IMPRESSION: Acute  pulmonary edema. Primarily interstitial, with early alveolar edema. Electronically Signed  By: Joseph Little M.D.   On: 03/08/2018 14:59   ASSESSMENT AND PLAN:  72 year old male patient with history of hypertension, CVA, diabetes mellitus, tobacco abuse currently in ICU for respiratory failure.  * Acute hypoxic respiratory failure Remains on BiPAP, mgmt per Intensivist   * Acute pulmonary edema - echocardiogram shows EF 20-25%  * New onset COPD with exacerbation continue IV Solu-Medrol and nebulization treatments Empirical IV Rocephin and Zithromax antibiotics  * Acute kidney injury Avoid nephrotoxic drugs Renal ultrasound normal Monitor renal function  * Elevated troponin secondary to demand ischemia  * Anemia. No evidence of active bleeding        All the records are reviewed and case discussed with Care Management/Social Worker. Management plans discussed with the patient, nursing and they are in agreement.  CODE STATUS: DNR  DVT Prophylaxis: SCDs  TOTAL TIME TAKING CARE OF THIS PATIENT: 15 minutes.   POSSIBLE D/C IN 2 to 3 DAYS, DEPENDING ON CLINICAL CONDITION.  Joseph Little M.D on 03/10/2018 at 1:15 PM  Between 7am to 6pm - Pager - [phone]  After 6pm go to [email or web address] - password [email or web address] [facility]  [facility] [place] [facility]  Office  [phone]  CC: Primary care physician; Joseph Nurse, MD  Note: This dictation was prepared with Dragon dictation along with smaller phrase technology. Any transcriptional errors that result from this process are unintentional.

## 2018-03-10 NOTE — Progress Notes (Signed)
Patient has been agitated on and off this shift. Kept taking off bipap, pulled out one of his three IVs, tried to pull on his foley and had to have several new pulse oximetry placed on his fingers. Difficult to redirect, remains confused and does not follow directions. No signs of pain or discomfort.

## 2018-03-10 NOTE — Progress Notes (Signed)
Follow up - Critical Care Medicine Note  Patient Details:    Joseph Little is an 72 y.o. male.with history of CVA, hypertension, and diabetes who presents to the ED for evaluation of shortness of breath with associated non-productive cough. He has smoked 1 PPD x 40 years. On arrival to the ED patient was noted to have an O2 sat of 48% on room air with cyanosis. He was placed on 15L non-rebreather with improvement in 87%. BiPAP was started Patient given duonebs, methylprednisolone, magnesium, and antibiotics in ICU. Code sepsis called.   Lines, Airways, Drains: Urethral Catheter Tess (Active)  Indication for Insertion or Continuance of Catheter Aggressive IV diuresis 03/09/2018  3:47 AM  Site Assessment Clean;Intact 03/09/2018  3:47 AM  Catheter Maintenance Bag below level of bladder;Catheter secured;Drainage bag/tubing not touching floor;Insertion date on drainage bag;No dependent loops;Seal intact 03/09/2018  3:47 AM  Collection Container Standard drainage bag 03/09/2018  3:47 AM  Securement Method Securing device (Describe) 03/09/2018  3:47 AM  Urinary Catheter Interventions Unclamped 03/09/2018  3:47 AM  Output (mL) 250 mL 03/09/2018  1:15 AM    Anti-infectives:  Anti-infectives (From admission, onward)   Start     Dose/Rate Route Frequency Ordered Stop   03/10/18 1000  cefTRIAXone (ROCEPHIN) 1 g in sodium chloride 0.9 % 100 mL IVPB     1 g 200 mL/hr over 30 Minutes Intravenous Every 24 hours 03/09/18 1024     03/09/18 1800  azithromycin (ZITHROMAX) 500 mg in sodium chloride 0.9 % 250 mL IVPB     500 mg 250 mL/hr over 60 Minutes Intravenous Every 24 hours 03/08/18 1942 03/13/18 1759   03/09/18 1000  cefTRIAXone (ROCEPHIN) 1 g in sodium chloride 0.9 % 100 mL IVPB  Status:  Discontinued     1 g 200 mL/hr over 30 Minutes Intravenous Every 12 hours 03/09/18 0804 03/09/18 0806   03/09/18 0900  cefTRIAXone (ROCEPHIN) 1 g in sodium chloride 0.9 % 100 mL IVPB  Status:  Discontinued     1 g 200 mL/hr over  30 Minutes Intravenous Every 24 hours 03/09/18 0806 03/09/18 1024   03/08/18 1715  levofloxacin (LEVAQUIN) IVPB 500 mg  Status:  Discontinued     500 mg 100 mL/hr over 60 Minutes Intravenous Every 24 hours 03/08/18 1711 03/08/18 1720   03/08/18 1515  ceFEPIme (MAXIPIME) 2 g in sodium chloride 0.9 % 100 mL IVPB     2 g 200 mL/hr over 30 Minutes Intravenous  Once 03/08/18 1507 03/08/18 1635   03/08/18 1515  vancomycin (VANCOCIN) IVPB 1000 mg/200 mL premix     1,000 mg 200 mL/hr over 60 Minutes Intravenous  Once 03/08/18 1507 03/08/18 1845   03/08/18 1445  azithromycin (ZITHROMAX) 500 mg in sodium chloride 0.9 % 250 mL IVPB     500 mg 250 mL/hr over 60 Minutes Intravenous  Once 03/08/18 1430 03/08/18 1601      Microbiology: Results for orders placed or performed during the hospital encounter of 03/08/18  Blood Culture (routine x 2)     Status: None (Preliminary result)   Collection Time: 03/08/18  4:03 PM  Result Value Ref Range Status   Specimen Description BLOOD RIGHT ANTECUBITAL  Final   Special Requests   Final    BOTTLES DRAWN AEROBIC AND ANAEROBIC Blood Culture adequate volume   Culture   Final    NO GROWTH 2 DAYS Performed at [facility], [address], [place], [place] [address]    Report Status PENDING  Incomplete  Blood Culture (routine x 2)     Status: None (Preliminary result)   Collection Time: 03/08/18  4:03 PM  Result Value Ref Range Status   Specimen Description BLOOD BLOOD LEFT HAND  Final   Special Requests   Final    BOTTLES DRAWN AEROBIC AND ANAEROBIC Blood Culture adequate volume   Culture   Final    NO GROWTH 2 DAYS Performed at [facility], [address] [place], [place] [address]    Report Status PENDING  Incomplete   Studies: [place]as Renal  Result Date: 03/09/2018 CLINICAL DATA:  Acute renal failure. EXAM: RENAL / URINARY TRACT ULTRASOUND COMPLETE COMPARISON:  None. FINDINGS: Right Kidney: Length: 11.5 cm. Echogenicity within normal  limits. No mass or hydronephrosis visualized. Left Kidney: Length: 11.4 cm. 1.6 cm exophytic simple cyst is seen arising from upper pole. Echogenicity within normal limits. No mass or hydronephrosis visualized. Bladder: Decompressed secondary to Foley catheter. IMPRESSION: No significant renal abnormality seen. Electronically Signed   By: Lupita RaiderJames  Green Jr, M.D.   On: 03/09/2018 09:41   Dg Chest Port 1 View  Result Date: 03/08/2018 CLINICAL DATA:  Shortness of breath. EXAM: PORTABLE CHEST 1 VIEW COMPARISON:  None. FINDINGS: Heart size upper limits of normal. Aortic atherosclerosis. Acute interstitial and early alveolar edema pattern. No visible effusion. No consolidation or lobar collapse. No bone abnormality. IMPRESSION: Acute pulmonary edema. Primarily interstitial, with early alveolar edema. Electronically Signed   By: Paulina FusiMark  Shogry M.D.   On: 03/08/2018 14:59    Consults:    Subjective:    Overnight Issues: Has had difficulty throughout the day and overnight with hypotension requiring pressors, norepinephrine, respiratory distress and agitation requiring BiPAP and Precedex. Presently resting comfortably on BiPAP and Precedex infusion  Objective:  Vital signs for last 24 hours: Temp:  [97 F (36.1 C)-98.5 F (36.9 C)] 97 F (36.1 C) (08/03 0800) Pulse Rate:  [56-116] 59 (08/03 0830) Resp:  [0-31] 16 (08/03 0830) BP: (62-133)/(38-98) 131/64 (08/03 0830) SpO2:  [85 %-100 %] 95 % (08/03 0830) FiO2 (%):  [65 %-99 %] 65 % (08/03 0800) Weight:  [181 lb 7 oz (82.3 kg)] 181 lb 7 oz (82.3 kg) (08/03 0445)  Hemodynamic parameters for last 24 hours:    Intake/Output from previous day: 08/02 0701 - 08/03 0700 In: 1447.8 [I.V.:462.8; IV Piggyback:985] Out: 550 [Urine:550]  Intake/Output this shift: Total I/O In: 57.4 [I.V.:57.4] Out: -   Vent settings for last 24 hours: FiO2 (%):  [65 %-99 %] 65 %  Physical Exam:  Constitutional: presently sedated on Precedex.  Resting on BiPAP  HENT:   Head: Normocephalic and atraumatic.  Eyes: Pupils are equal, round, and reactive to light.  Cardiovascular:  Tachycardic, regular rhythm. Normal S1 and S2. No murmurs, rubs, gallops. Dorsalis pedis pulses 1+ bilaterally.   Pulmonary/Chest:  Bilateral expiratory wheezing heard anteriorly. No rales, rhonchi.   Abdominal:  Soft, non-distended abdomen. No tenderness to palpation. Positive bowel sounds.   Musculoskeletal: Normal range of motion. He exhibits no edema.  Neurological: He is alert and oriented to person, place, and time. No cranial nerve deficit.       Assessment/Plan:   Respiratory failure. Most likely secondary to COPD with superimposed pulmonary edema. Patient is presently on BiPAP, lbuterol, Atrovent, Solu-Medrol, azithromycin, Rocephin, requiring Precedex for comfort and norepinephrine for hypotension. Patient is a DO NOT RESUSCITATEWill need to discuss with family outlining further goals of care if unable to make progress with patient's deteriorating status  Hyperglycemia. On sliding scale coverage  Leukocytosis. On broad-spectrum antibiotic coverage  Anemia. No evidence of active bleeding  Renal insufficiency.worsening renal function. Will support hemodynamics, avoid nephrotoxic agents, renal ultrasound is negative, bladder scan does not reveal retained urine  Positive troponin. We'll cycle troponins, EKG reveals atrial fibrillation with nonspecific interventricular conduction delay with repolarization abnormalities. Will check echocardiography   Critical Care Total Time. 40 Minutes  Titan Karner,[place] 03/10/2018  *Care during the described time interval was provided by me and/or other providers on the critical care team.  I have reviewed this patient's available data, including medical history, events of note, physical examination and test results as part of my evaluation. Patient ID: Izen Petz, male   DOB: Jan 18, 1946, 72 y.o.   MRN: [id number]

## 2018-03-10 NOTE — Progress Notes (Signed)
FIO2 increased to .72

## 2018-03-11 LAB — GLUCOSE, CAPILLARY
Glucose-Capillary: 132 mg/dL — ABNORMAL HIGH (ref 70–99)
Glucose-Capillary: 177 mg/dL — ABNORMAL HIGH (ref 70–99)
Glucose-Capillary: 191 mg/dL — ABNORMAL HIGH (ref 70–99)
Glucose-Capillary: 195 mg/dL — ABNORMAL HIGH (ref 70–99)
Glucose-Capillary: 197 mg/dL — ABNORMAL HIGH (ref 70–99)
Glucose-Capillary: 66 mg/dL — ABNORMAL LOW (ref 70–99)
Glucose-Capillary: 80 mg/dL (ref 70–99)

## 2018-03-11 LAB — CBC WITH DIFFERENTIAL/PLATELET
Basophils Absolute: 0 10*3/uL (ref 0–0.1)
Basophils Relative: 0 %
EOS PCT: 0 %
Eosinophils Absolute: 0 10*3/uL (ref 0–0.7)
HEMATOCRIT: 32.8 % — AB (ref 40.0–52.0)
Hemoglobin: 11.1 g/dL — ABNORMAL LOW (ref 13.0–18.0)
LYMPHS PCT: 6 %
Lymphs Abs: 1.6 10*3/uL (ref 1.0–3.6)
MCH: 31.5 pg (ref 26.0–34.0)
MCHC: 33.7 g/dL (ref 32.0–36.0)
MCV: 93.5 fL (ref 80.0–100.0)
MONOS PCT: 3 %
Monocytes Absolute: 0.7 10*3/uL (ref 0.2–1.0)
NEUTROS ABS: 22.7 10*3/uL — AB (ref 1.4–6.5)
Neutrophils Relative %: 91 %
Platelets: 162 10*3/uL (ref 150–440)
RBC: 3.51 MIL/uL — ABNORMAL LOW (ref 4.40–5.90)
RDW: 14.2 % (ref 11.5–14.5)
WBC: 25 10*3/uL — ABNORMAL HIGH (ref 3.8–10.6)

## 2018-03-11 LAB — URINE CULTURE: Culture: NO GROWTH

## 2018-03-11 LAB — BASIC METABOLIC PANEL
ANION GAP: 9 (ref 5–15)
BUN: 81 mg/dL — AB (ref 8–23)
CO2: 19 mmol/L — AB (ref 22–32)
Calcium: 8.6 mg/dL — ABNORMAL LOW (ref 8.9–10.3)
Chloride: 111 mmol/L (ref 98–111)
Creatinine, Ser: 1.94 mg/dL — ABNORMAL HIGH (ref 0.61–1.24)
GFR calc Af Amer: 39 mL/min — ABNORMAL LOW (ref >60)
GFR calc non Af Amer: 33 mL/min — ABNORMAL LOW (ref >60)
GLUCOSE: 175 mg/dL — AB (ref 70–99)
POTASSIUM: 4.6 mmol/L (ref 3.5–5.1)
Sodium: 139 mmol/L (ref 135–145)

## 2018-03-11 MED ORDER — AMLODIPINE BESYLATE 10 MG PO TABS
10.0000 mg | ORAL_TABLET | Freq: Every day | ORAL | Status: DC
  Administered 2018-03-11 – 2018-03-14 (×4): 10 mg via ORAL
  Filled 2018-03-11 (×4): qty 1

## 2018-03-11 MED ORDER — METOPROLOL TARTRATE 5 MG/5ML IV SOLN
5.0000 mg | INTRAVENOUS | Status: AC
  Administered 2018-03-11: 5 mg via INTRAVENOUS

## 2018-03-11 MED ORDER — AMIODARONE IV BOLUS ONLY 150 MG/100ML
INTRAVENOUS | Status: AC
  Administered 2018-03-11: 150 mg via INTRAVENOUS
  Filled 2018-03-11: qty 100

## 2018-03-11 MED ORDER — AMIODARONE HCL IN DEXTROSE 360-4.14 MG/200ML-% IV SOLN: 30.0000 mg/h | INTRAVENOUS | Status: DC

## 2018-03-11 MED ORDER — AMIODARONE HCL IN DEXTROSE 360-4.14 MG/200ML-% IV SOLN
60.0000 mg/h | INTRAVENOUS | Status: AC
  Administered 2018-03-11 (×2): 60 mg/h via INTRAVENOUS
  Filled 2018-03-11 (×2): qty 200

## 2018-03-11 MED ORDER — CARVEDILOL 3.125 MG PO TABS
6.2500 mg | ORAL_TABLET | Freq: Two times a day (BID) | ORAL | Status: DC
  Administered 2018-03-11 – 2018-03-14 (×6): 6.25 mg via ORAL
  Filled 2018-03-11 (×6): qty 1

## 2018-03-11 MED ORDER — AMIODARONE IV BOLUS ONLY 150 MG/100ML
150.0000 mg | Freq: Once | INTRAVENOUS | Status: AC
  Administered 2018-03-11: 150 mg via INTRAVENOUS

## 2018-03-11 MED ORDER — METOPROLOL TARTRATE 5 MG/5ML IV SOLN
INTRAVENOUS | Status: AC
Start: 2018-03-11 — End: 2018-03-11
  Administered 2018-03-11: 5 mg via INTRAVENOUS
  Filled 2018-03-11: qty 5

## 2018-03-11 NOTE — Progress Notes (Signed)
Had to go up on HFNC setting, but tolerating now. Off Precedex and Levo. Awake all night but alert and oriented and very pleasant. Family called this AM to check progress. No complaints of pain or discomfort. Urine less dark red, more blood tinged this AM. Refused several meds this shift. Gave Lantus later once explained it would help with his sugars. CBG required coverage all shift. Continue to monitor.

## 2018-03-11 NOTE — Progress Notes (Signed)
[employer] [facility] - [place] at [facility]   PATIENT NAME: Joseph RipaJohn Little    MR#:  [id number]  DATE OF BIRTH:  [birth year]  SUBJECTIVE:  CHIEF COMPLAINT:   Chief Complaint  Patient presents with  . Respiratory Distress  on HFNC, tachycardic REVIEW OF SYSTEMS:    ROS  CONSTITUTIONAL: No documented fever. Has fatigue, weakness. No weight gain, no weight loss.  EYES: No blurry or double vision.  ENT: No tinnitus. No postnasal drip. No redness of the oropharynx.  RESPIRATORY: Decreased cough, wheeze, no hemoptysis. Has dyspnea.  CARDIOVASCULAR: No chest pain. No orthopnea. No palpitations. No syncope.  GASTROINTESTINAL: No nausea, no vomiting or diarrhea. No abdominal pain. No melena or hematochezia.  GENITOURINARY: No dysuria or hematuria.  ENDOCRINE: No polyuria or nocturia. No heat or cold intolerance.  HEMATOLOGY: No anemia. No bruising. No bleeding.  INTEGUMENTARY: No rashes. No lesions.  MUSCULOSKELETAL: No arthritis. No swelling. No gout.  NEUROLOGIC: No numbness, tingling, or ataxia. No seizure-type activity.  PSYCHIATRIC: No anxiety. No insomnia. No ADD.  DRUG ALLERGIES:  No Known Allergies  VITALS:  Blood pressure (!) 150/64, pulse (!) 116, temperature 98.2 F (36.8 C), temperature source Oral, resp. rate (!) 31, height 6\' 1"  (1.854 m), weight 80.7 kg (177 lb 14.6 oz), SpO2 95 %. PHYSICAL EXAMINATION:   Physical Exam  GENERAL:  72 y.o.-year-old patient lying in the bed with no acute distress.  EYES: Pupils equal, round, reactive to light and accommodation. No scleral icterus. Extraocular muscles intact.  HEENT: Head atraumatic, normocephalic. Oropharynx and nasopharynx clear.  NECK:  Supple, no jugular venous distention. No thyroid enlargement, no tenderness.  LUNGS: Decreased breath sounds bilaterally, bilateral wheezing, rales. No use of accessory muscles of respiration.  CARDIOVASCULAR: S1, S2 normal. No murmurs, rubs, or gallops.  ABDOMEN: Soft,  nontender, nondistended. Bowel sounds present. No organomegaly or mass.  EXTREMITIES: No cyanosis, clubbing or edema b/l.    NEUROLOGIC: Cranial nerves II through XII are intact. No focal Motor or sensory deficits b/l.   PSYCHIATRIC: The patient is alert and oriented x 3.  SKIN: No obvious rash, lesion, or ulcer.  LABORATORY PANEL:   CBC Recent Labs  Lab 03/11/18 1109  WBC 25.0*  HGB 11.1*  HCT 32.8*  PLT 162   ------------------------------------------------------------------------------------------------------------------ Chemistries  Recent Labs  Lab 03/11/18 1109  NA 139  K 4.6  CL 111  CO2 19*  GLUCOSE 175*  BUN 81*  CREATININE 1.94*  CALCIUM 8.6*   ------------------------------------------------------------------------------------------------------------------  Cardiac Enzymes Recent Labs  Lab 03/08/18 1422  TROPONINI 0.19*   ------------------------------------------------------------------------------------------------------------------  RADIOLOGY:  No results found. ASSESSMENT AND PLAN:  72 year old male patient with history of hypertension, CVA, diabetes mellitus, tobacco abuse currently in ICU for respiratory failure.  * Acute hypoxic respiratory failure Off BiPAP - remains on HFNC, mgmt per Intensivist   * Acute pulmonary edema - echocardiogram shows EF 20-25% - Diuretics as need per PCCM  * New onset COPD with exacerbation continue IV Solu-Medrol and nebulization treatments Empirical IV Rocephin. stopped Zithromax  * Acute kidney injury Avoid nephrotoxic drugs Renal ultrasound normal Monitor renal function  * DM: SSI & lantus 25 units SQ QHS  * Elevated troponin secondary to demand ischemia  * Anemia. No evidence of active bleeding        All the records are reviewed and case discussed with Care Management/Social Worker. Management plans discussed with the patient, nursing and they are in agreement.  CODE STATUS: DNR  DVT  Prophylaxis: SCDs  TOTAL TIME TAKING CARE OF THIS PATIENT: 15 minutes.   POSSIBLE D/C IN 2 to 3 DAYS, DEPENDING ON CLINICAL CONDITION.  Delfino Lovett M.D on 03/11/2018 at 1:55 PM  Between 7am to 6pm - Pager - [phone]  After 6pm go to [email or web address] - password [email or web address] [facility]  [facility] [place] [facility]  Office  [phone]  CC: Primary care physician; Gracelyn Nurse, MD  Note: This dictation was prepared with Dragon dictation along with smaller phrase technology. Any transcriptional errors that result from this process are unintentional.

## 2018-03-11 NOTE — Progress Notes (Signed)
Follow up - Critical Care Medicine Note  Patient Details:    Joseph RipaJohn Little is an 72 y.o. male.with history of CVA, hypertension, and diabetes who presents to the ED for evaluation of shortness of breath with associated non-productive cough. He has smoked 1 PPD x 40 years. On arrival to the ED patient was noted to have an O2 sat of 48% on room air with cyanosis. He was placed on 15L non-rebreather with improvement in 87%. BiPAP was started Patient given duonebs, methylprednisolone, magnesium, and antibiotics in ICU. Code sepsis called.   Lines, Airways, Drains: Urethral Catheter Tess (Active)  Indication for Insertion or Continuance of Catheter Aggressive IV diuresis 03/09/2018  3:47 AM  Site Assessment Clean;Intact 03/09/2018  3:47 AM  Catheter Maintenance Bag below level of bladder;Catheter secured;Drainage bag/tubing not touching floor;Insertion date on drainage bag;No dependent loops;Seal intact 03/09/2018  3:47 AM  Collection Container Standard drainage bag 03/09/2018  3:47 AM  Securement Method Securing device (Describe) 03/09/2018  3:47 AM  Urinary Catheter Interventions Unclamped 03/09/2018  3:47 AM  Output (mL) 250 mL 03/09/2018  1:15 AM    Anti-infectives:  Anti-infectives (From admission, onward)   Start     Dose/Rate Route Frequency Ordered Stop   03/10/18 1000  cefTRIAXone (ROCEPHIN) 1 g in sodium chloride 0.9 % 100 mL IVPB     1 g 200 mL/hr over 30 Minutes Intravenous Every 24 hours 03/09/18 1024     03/09/18 1800  azithromycin (ZITHROMAX) 500 mg in sodium chloride 0.9 % 250 mL IVPB     500 mg 250 mL/hr over 60 Minutes Intravenous Every 24 hours 03/08/18 1942 03/13/18 1759   03/09/18 1000  cefTRIAXone (ROCEPHIN) 1 g in sodium chloride 0.9 % 100 mL IVPB  Status:  Discontinued     1 g 200 mL/hr over 30 Minutes Intravenous Every 12 hours 03/09/18 0804 03/09/18 0806   03/09/18 0900  cefTRIAXone (ROCEPHIN) 1 g in sodium chloride 0.9 % 100 mL IVPB  Status:  Discontinued     1 g 200 mL/hr over  30 Minutes Intravenous Every 24 hours 03/09/18 0806 03/09/18 1024   03/08/18 1715  levofloxacin (LEVAQUIN) IVPB 500 mg  Status:  Discontinued     500 mg 100 mL/hr over 60 Minutes Intravenous Every 24 hours 03/08/18 1711 03/08/18 1720   03/08/18 1515  ceFEPIme (MAXIPIME) 2 g in sodium chloride 0.9 % 100 mL IVPB     2 g 200 mL/hr over 30 Minutes Intravenous  Once 03/08/18 1507 03/08/18 1635   03/08/18 1515  vancomycin (VANCOCIN) IVPB 1000 mg/200 mL premix     1,000 mg 200 mL/hr over 60 Minutes Intravenous  Once 03/08/18 1507 03/08/18 1845   03/08/18 1445  azithromycin (ZITHROMAX) 500 mg in sodium chloride 0.9 % 250 mL IVPB     500 mg 250 mL/hr over 60 Minutes Intravenous  Once 03/08/18 1430 03/08/18 1601      Microbiology: Results for orders placed or performed during the hospital encounter of 03/08/18  Blood Culture (routine x 2)     Status: None (Preliminary result)   Collection Time: 03/08/18  4:03 PM  Result Value Ref Range Status   Specimen Description BLOOD RIGHT ANTECUBITAL  Final   Special Requests   Final    BOTTLES DRAWN AEROBIC AND ANAEROBIC Blood Culture adequate volume   Culture   Final    NO GROWTH 3 DAYS Performed at [facility], [address] [place], [place] [address]    Report Status PENDING  Incomplete  Blood Culture (routine x 2)     Status: None (Preliminary result)   Collection Time: 03/08/18  4:03 PM  Result Value Ref Range Status   Specimen Description BLOOD BLOOD LEFT HAND  Final   Special Requests   Final    BOTTLES DRAWN AEROBIC AND ANAEROBIC Blood Culture adequate volume   Culture   Final    NO GROWTH 3 DAYS Performed at [facility], [address] [place], [place] [address]    Report Status PENDING  Incomplete   Studies: US Renal  Result Date: 03/09/2018 CLINICAL DATA:  Acute renal failure. EXAM: RENAL / URINARY TRACT ULTRASOUND COMPLETE COMPARISON:  None. FINDINGS: Right Kidney: Length: 11.5 cm. Echogenicity within normal  limits. No mass or hydronephrosis visualized. Left Kidney: Length: 11.4 cm. 1.6 cm exophytic simple cyst is seen arising from upper pole. Echogenicity within normal limits. No mass or hydronephrosis visualized. Bladder: Decompressed secondary to Foley catheter. IMPRESSION: No significant renal abnormality seen. Electronically Signed   By: Lupita Raider, M.D.   On: 03/09/2018 09:41   Dg Chest Port 1 View  Result Date: 03/08/2018 CLINICAL DATA:  Shortness of breath. EXAM: PORTABLE CHEST 1 VIEW COMPARISON:  None. FINDINGS: Heart size upper limits of normal. Aortic atherosclerosis. Acute interstitial and early alveolar edema pattern. No visible effusion. No consolidation or lobar collapse. No bone abnormality. IMPRESSION: Acute pulmonary edema. Primarily interstitial, with early alveolar edema. Electronically Signed   By: Paulina Fusi M.D.   On: 03/08/2018 14:59    Consults:    Subjective:    Overnight Issues: Patient had a better day yesterday, Precedex was weaned to low dose. Has been successfully weaned off of his pressors and BiPAP. Presently on heated high flow at 70%. Awake alert and communicating in no acute distress  Objective:  Vital signs for last 24 hours: Temp:  [97.2 F (36.2 C)-97.9 F (36.6 C)] 97.9 F (36.6 C) (08/04 0200) Pulse Rate:  [54-123] 123 (08/04 0754) Resp:  [12-29] 23 (08/04 0754) BP: (69-155)/(37-97) 155/71 (08/04 0700) SpO2:  [87 %-100 %] 97 % (08/04 0754) FiO2 (%):  [40 %-95 %] 73 % (08/04 0754) Weight:  [177 lb 14.6 oz (80.7 kg)] 177 lb 14.6 oz (80.7 kg) (08/04 0530)  Hemodynamic parameters for last 24 hours:    Intake/Output from previous day: 08/03 0701 - 08/04 0700 In: 949.4 [I.V.:557.7; IV Piggyback:391.7] Out: 1235 [Urine:1235]  Intake/Output this shift: No intake/output data recorded.  Vent settings for last 24 hours: FiO2 (%):  [40 %-95 %] 73 %  Physical Exam:   HENT:  Head: Normocephalic and atraumatic.  Eyes: Pupils are equal, round,  and reactive to light.  Cardiovascular: regular rate and rhythm  Pulmonary/Chest:  improved aeration clear this morning Abdominal: Soft, non-distended abdomen. No tenderness to palpation. Positive bowel sounds.   Musculoskeletal: Normal range of motion. He exhibits no edema.  Neurological: He is alert and oriented to person, place, and time. No cranial nerve deficit.    Assessment/Plan:   Respiratory failure. Most likely secondary to COPD with superimposed pulmonary edema. Patient is presently on heated high flow, lbuterol, Atrovent, Solu-Medrol, azithromycin.  Hyperglycemia. On sliding scale coverage  Leukocytosis. On broad-spectrum antibiotic coverage  Anemia. Has had blood in urine, pending repeat CBC from this morning  Renal insufficiency.worsening renal function. Has had blood in urine, will recheck BMP this morning although per family patient would not be a hemodialysis candidate  Family discussion yesterday. They do not wish to be too  aggressive. They do not want him on BiPAP indefinitely. He would not be someone to receive hemodialysis. If we can make progress toward weaning him off the Precedex and BiPAP would be the goal otherwise at some point if patient's status deteriorates would transition to comfort care  Critical Care Total Time. 30 Minutes  Joseph Little,Joseph Little 03/11/2018  *Care during the described time interval was provided by me and/or other providers on the critical care team.  I have reviewed this patient's available data, including medical history, events of note, physical examination and test results as part of my evaluation. Patient ID: Joseph Little, male   DOB: 1946-04-09, 72 y.o.   MRN: [id number] Patient ID: Joseph Little, male   DOB: 08-20-1945, 72 y.o.   MRN: [id number]

## 2018-03-12 DIAGNOSIS — I5021 Acute systolic (congestive) heart failure: Secondary | ICD-10-CM

## 2018-03-12 LAB — GLUCOSE, CAPILLARY
GLUCOSE-CAPILLARY: 184 mg/dL — AB (ref 70–99)
GLUCOSE-CAPILLARY: 171 mg/dL — AB (ref 70–99)
GLUCOSE-CAPILLARY: 144 mg/dL — AB (ref 70–99)
Glucose-Capillary: 119 mg/dL — ABNORMAL HIGH (ref 70–99)
Glucose-Capillary: 166 mg/dL — ABNORMAL HIGH (ref 70–99)
Glucose-Capillary: 318 mg/dL — ABNORMAL HIGH (ref 70–99)

## 2018-03-12 LAB — MRSA PCR SCREENING: MRSA by PCR: NEGATIVE

## 2018-03-12 LAB — BASIC METABOLIC PANEL
ANION GAP: 10 (ref 5–15)
BUN: 89 mg/dL — ABNORMAL HIGH (ref 8–23)
CALCIUM: 8.4 mg/dL — AB (ref 8.9–10.3)
CHLORIDE: 110 mmol/L (ref 98–111)
CO2: 18 mmol/L — AB (ref 22–32)
CREATININE: 2.39 mg/dL — AB (ref 0.61–1.24)
GFR calc non Af Amer: 26 mL/min — ABNORMAL LOW (ref >60)
GFR, EST AFRICAN AMERICAN: 30 mL/min — AB (ref >60)
GLUCOSE: 183 mg/dL — AB (ref 70–99)
Potassium: 5.4 mmol/L — ABNORMAL HIGH (ref 3.5–5.1)
Sodium: 138 mmol/L (ref 135–145)

## 2018-03-12 MED ORDER — FUROSEMIDE 10 MG/ML IJ SOLN
40.0000 mg | Freq: Once | INTRAMUSCULAR | Status: AC
  Administered 2018-03-12: 40 mg via INTRAVENOUS
  Filled 2018-03-12: qty 4

## 2018-03-12 MED ORDER — METHYLPREDNISOLONE SODIUM SUCC 40 MG IJ SOLR
40.0000 mg | Freq: Two times a day (BID) | INTRAMUSCULAR | Status: DC
  Administered 2018-03-12 – 2018-03-13 (×4): 40 mg via INTRAVENOUS
  Filled 2018-03-12 (×5): qty 1

## 2018-03-12 MED ORDER — ALPRAZOLAM 0.5 MG PO TABS
0.5000 mg | ORAL_TABLET | Freq: Every evening | ORAL | Status: DC | PRN
  Administered 2018-03-13: 0.5 mg via ORAL
  Filled 2018-03-12: qty 1

## 2018-03-12 MED ORDER — AMIODARONE HCL 200 MG PO TABS
200.0000 mg | ORAL_TABLET | Freq: Two times a day (BID) | ORAL | Status: DC
  Administered 2018-03-13 – 2018-03-14 (×3): 200 mg via ORAL
  Filled 2018-03-12 (×3): qty 1

## 2018-03-12 NOTE — Progress Notes (Signed)
Family at bedside and patient has been in good spirits since speaking with Dr. Belia HemanKasa and removal of bipap mask.  Alert with no respiratory distress on high flow.

## 2018-03-12 NOTE — Progress Notes (Signed)
[facility] [facility]   PATIENT NAME: Joseph Little    MR#:  [id number]  DATE OF BIRTH:  1946/02/16  SUBJECTIVE:  Feeling better. Remains on HFNC. Breathing somewhat improved.  REVIEW OF SYSTEMS:    ROS  CONSTITUTIONAL: No documented fever. Has fatigue, weakness. No weight gain, no weight loss.  EYES: No blurry or double vision.  ENT: No tinnitus. No postnasal drip. No redness of the oropharynx.  RESPIRATORY: Decreased cough, wheeze, no hemoptysis. Has dyspnea.  CARDIOVASCULAR: No chest pain. No orthopnea. No palpitations. No syncope.  GASTROINTESTINAL: No nausea, no vomiting or diarrhea. No abdominal pain. No melena or hematochezia.  GENITOURINARY: No dysuria or hematuria.  ENDOCRINE: No polyuria or nocturia. No heat or cold intolerance.  HEMATOLOGY: No anemia. No bruising. No bleeding.  INTEGUMENTARY: No rashes. No lesions.  MUSCULOSKELETAL: No arthritis. No swelling. No gout.  NEUROLOGIC: No numbness, tingling, or ataxia. No seizure-type activity.  PSYCHIATRIC: No anxiety. No insomnia. No ADD.  DRUG ALLERGIES:  No Known Allergies  VITALS:  Blood pressure 117/85, pulse 86, temperature (!) 96.6 F (35.9 C), temperature source Axillary, resp. rate (!) 25, height 6\' 1"  (1.854 m), weight 83.4 kg (183 lb 13.8 oz), SpO2 91 %. PHYSICAL EXAMINATION:   Physical Exam  GENERAL:  72 y.o.-year-old patient sitting up in bed, with no acute distress.  EYES: Pupils equal, round, reactive to light and accommodation. No scleral icterus. Extraocular muscles intact.  HEENT: Head atraumatic, normocephalic. Oropharynx and nasopharynx clear.  NECK:  Supple, no jugular venous distention. No thyroid enlargement, no tenderness.  LUNGS: Decreased breath sounds bilaterally, bilateral wheezing, rales. No use of accessory muscles of respiration. HFNC in place. CARDIOVASCULAR: S1, S2 normal. No murmurs, rubs, or gallops.  ABDOMEN: Soft, nontender, nondistended. Bowel sounds  present. No organomegaly or mass.  EXTREMITIES: No cyanosis, clubbing or edema b/l.    NEUROLOGIC: Cranial nerves II through XII are intact. No focal Motor or sensory deficits b/l.   PSYCHIATRIC: The patient is alert and oriented x 3.  SKIN: No obvious rash, lesion, or ulcer.  LABORATORY PANEL:   CBC Recent Labs  Lab 03/11/18 1109  WBC 25.0*  HGB 11.1*  HCT 32.8*  PLT 162   ------------------------------------------------------------------------------------------------------------------ Chemistries  Recent Labs  Lab 03/12/18 0942  NA 138  K 5.4*  CL 110  CO2 18*  GLUCOSE 183*  BUN 89*  CREATININE 2.39*  CALCIUM 8.4*   ------------------------------------------------------------------------------------------------------------------  Cardiac Enzymes Recent Labs  Lab 03/08/18 1422  TROPONINI 0.19*   ------------------------------------------------------------------------------------------------------------------  RADIOLOGY:  No results found. ASSESSMENT AND PLAN:  72 year old male patient with history of hypertension, CVA, diabetes mellitus, tobacco abuse currently in ICU for respiratory failure.  * Acute hypoxic respiratory failure - on HFNC, BiPAP prn - nebulizers prn - on IV solumedrol - per CCM   * Acute pulmonary edema - echocardiogram shows EF 20-25% - Diuretics as needed per PCCM  * New onset COPD with exacerbation  - continue ceftriaxone - continue IV Solu-Medrol and nebulizers prn - blood cultures negative  * Acute kidney injury- improving - Avoid nephrotoxic drugs - Renal ultrasound normal - Monitor renal function  *A-fib - started on amio gtt and converted to NSR - coreg bid  * DM: SSI & lantus 25 units SQ QHS  * Elevated troponin secondary to demand ischemia  * Anemia. No evidence of active bleeding    All the records are reviewed and case discussed with Care Management/Social Worker. Management plans discussed with the  patient,  nursing and they are in agreement.  CODE STATUS: DNR  DVT Prophylaxis: SCDs  TOTAL TIME TAKING CARE OF THIS PATIENT: 15 minutes.   POSSIBLE D/C IN 2 to 3 DAYS, DEPENDING ON CLINICAL CONDITION.  Jinny BlossomKaty D Kentavious Michele M.D on 03/12/2018 at 4:23 PM  Between 7am to 6pm - Pager - [phone]  After 6pm go to [email or web address] - password [email or web address] [facility]  [facility]  Office  [phone]  CC: Primary care physician; Gracelyn NurseJohnston, Kin D, MD  Note: This dictation was prepared with Dragon dictation along with smaller phrase technology. Any transcriptional errors that result from this process are unintentional.

## 2018-03-12 NOTE — Progress Notes (Signed)
CRITICAL CARE NOTE  CC  follow up respiratory failure secondary to to COPD exacerbation with HFrEF  SUBJECTIVE Patient remains critically ill Prognosis is guarded  72 year old male admitted on 8/1 for acute hypoxic respiratory failure with COPD and CHF exacerbation. Patient tolerated off BiPAP well over the weekend. Amiodarone drip started yesterday after episode of Afib. He is now in NSR and off of the amiodarone drip. Patient tells me he is feeling better now that he is off of the BiPAP. He denies shortness of breath, but endorses non-productive cough.    SIGNIFICANT EVENTS This morning, patient's O2 sat dropped so BiPAP and precedex restarted. Patient is now off of precedex and BiPAP; HFNC started right before I examined the patient at 98%.   BP 121/67   Pulse 82   Temp (!) 96.6 F (35.9 C) (Axillary)   Resp (!) 27   Ht 6\' 1"  (1.854 m)   Wt 183 lb 13.8 oz (83.4 kg)   SpO2 93%   BMI 24.26 kg/m    REVIEW OF SYSTEMS  GENERAL: -fever/chills, +fatigue HEENT: -headache, -eye pain, -eye discharge -ear pain, -ear discharge, -congestion, -sore throat,  PULMONARY: +cough, -sputum production, -hemoptysis, -wheezing, -SOB CARDIOVASCULAR: -chest pain, -palpitations, -orthopnea, -PND, -edema GASTROINTESTINAL: -abdominal pain, -nausea, -vomiting, -diarrhea, -constipation MUSCULOSKELETAL:-myalgias, -joint pain NEUROLOGIC -dizziness, -lightheadedness, -LOC, -weakness SKIN: -itching, -rash   PHYSICAL EXAMINATION:  GENERAL:critically ill appearing, +resp distress HEAD: Normocephalic, atraumatic.  EYES: Pupils equal, round, reactive to light.  No scleral icterus.  MOUTH: Moist mucosal membrane. NECK: Supple. No thyromegaly. No nodules. No JVD.  PULMONARY: clear to auscultation bilaterally, tachypnea, on HFNC  CARDIOVASCULAR: S1 and S2. Regular rate and rhythm. No murmurs, rubs, or gallops.  GASTROINTESTINAL: Soft, nontender, -distended. No masses. Positive bowel sounds. No  hepatosplenomegaly.  MUSCULOSKELETAL: No swelling, clubbing, or edema.  NEUROLOGIC: alert and oriented, cooperative. SKIN:intact,warm,dry  ASSESSMENT AND PLAN SYNOPSIS  72 year old male with history of hypertension, DM, and CVA who was admitted on 8/1 for acute hypoxic respiratory failure. Patient is status DO NOT RESUSCITATE. Weaned off BiPAP over the weekend to HFNC, however back on BiPAP this morning for a short period. Family discussion indicated patient would not want to be on BiPAP indefinitely and would not want to receive HD if needed. If he deteriorates, he would transition to comfort care.   Severe Hypoxic and Hypercapnic Respiratory Failure secondary to COPD exacerbation -wean HFNC as tolerated -continue BiPAP as needed -continue Bronchodilator Therapy and NEB therapy -continue solumedrol   Renal Failure -creatinine improved yesterday: 2.78->1.94, will order BMP today -hyperkalemia resolved -renal US: no abnormality -patient would NOT want hemodialysis if needed -monitor I/Os and hematuria   ENDOCRINOLOGY -diabetes mellitus -on SSI -continue lantus QHS   CARDIAC -episode of atrial fibrillation started: placed on amiodarone gtt and converted to NSR -continue carvedilol BID. -echo: showed EF of 20-25% with mitral regurgitation  ID -continue IV abx as prescribed: rocephin -follow up cultures:  -urine and blood cultures without growth -procalcitonin 4.12 -leukocytosis trending down: 31.3-->25.0   DVT: heparin GI: pepcid  TRANSFUSIONS AS NEEDED MONITOR FSBS ASSESS the need for LABS as needed   Critical Care Time devoted to patient care services described in this note is 32 minutes.   Overall, patient is critically ill, prognosis is guarded.  Patient with Multiorgan failure and at high risk for cardiac arrest and death.      Lucie LeatherKurian David Melena Hayes, M.D.  [facility]  Medical Director [facility]  Medical Director [facility]  Cardio-Pulmonary Department

## 2018-03-12 NOTE — Progress Notes (Signed)
Patient restarted on bipap due to dropping sats and agitation. Precedex also restarted to allow patient to tolerate bipap and rest, since he had been awake for more than 24 hours. Tolerated mask, and starting to come back down on Precedex. Remains alert and oriented this AM. More relaxed and tolerating bipap. Continue to monitor.

## 2018-03-12 NOTE — Progress Notes (Signed)
During rounds Dr. Belia HemanKasa gave order to stop amiodarone drip and gave order to pharmacy to order PO amiodarone.

## 2018-03-12 NOTE — Progress Notes (Signed)
CRITICAL CARE NOTE  CC  follow up respiratory failure secondary to to COPD exacerbation with HFrEF  SUBJECTIVE Patient remains critically ill Prognosis is guarded  72 year old male admitted on 8/1 for acute hypoxic respiratory failure with COPD and CHF exacerbation. Patient tolerated off BiPAP well over the weekend. Amiodarone drip started yesterday after episode of Afib. He is now in NSR and off of the amiodarone drip. Patient tells me he is feeling better now that he is off of the BiPAP. He denies shortness of breath, but endorses non-productive cough.    SIGNIFICANT EVENTS This morning, patient's O2 sat dropped so BiPAP and precedex restarted. Patient is now off of precedex and BiPAP; HFNC started right before I examined the patient at 98%.   BP 128/61   Pulse 63   Temp (!) 96.6 F (35.9 C) (Axillary)   Resp (!) 21   Ht 6\' 1"  (1.854 m)   Wt 83.4 kg (183 lb 13.8 oz)   SpO2 91%   BMI 24.26 kg/m    REVIEW OF SYSTEMS  GENERAL: -fever/chills, +fatigue HEENT: -headache, -eye pain, -eye discharge -ear pain, -ear discharge, -congestion, -sore throat,  PULMONARY: +cough, -sputum production, -hemoptysis, -wheezing, -SOB CARDIOVASCULAR: -chest pain, -palpitations, -orthopnea, -PND, -edema GASTROINTESTINAL: -abdominal pain, -nausea, -vomiting, -diarrhea, -constipation MUSCULOSKELETAL:-myalgias, -joint pain NEUROLOGIC -dizziness, -lightheadedness, -LOC, -weakness SKIN: -itching, -rash   PHYSICAL EXAMINATION:  GENERAL:critically ill appearing, +resp distress HEAD: Normocephalic, atraumatic.  EYES: Pupils equal, round, reactive to light.  No scleral icterus.  MOUTH: Moist mucosal membrane. NECK: Supple. No thyromegaly. No nodules. No JVD.  PULMONARY: clear to auscultation bilaterally, tachypnea, on HFNC  CARDIOVASCULAR: S1 and S2. Regular rate and rhythm. No murmurs, rubs, or gallops.  GASTROINTESTINAL: Soft, nontender, -distended. No masses. Positive bowel sounds. No  hepatosplenomegaly.  MUSCULOSKELETAL: No swelling, clubbing, or edema.  NEUROLOGIC: alert and oriented, cooperative. SKIN:intact,warm,dry  ASSESSMENT AND PLAN SYNOPSIS  72 year old male with history of hypertension, DM, and CVA who was admitted on 8/1 for acute hypoxic respiratory failure. Patient is status DO NOT RESUSCITATE. Weaned off BiPAP over the weekend to HFNC, however back on BiPAP this morning for a short period. Family discussion indicated patient would not want to be on BiPAP indefinitely and would not want to receive HD if needed. If he deteriorates, he would transition to comfort care.   Severe Hypoxic and Hypercapnic Respiratory Failure secondary to COPD exacerbation -wean HFNC as tolerated -continue BiPAP as needed -continue Bronchodilator Therapy and NEB therapy -continue solumedrol   Renal Failure -creatinine improved yesterday: 2.78->1.94, will order BMP today -hyperkalemia resolved -renal US: no abnormality -patient would NOT want hemodialysis if needed -monitor I/Os and hematuria   ENDOCRINOLOGY -diabetes mellitus -on SSI -continue lantus QHS   CARDIAC -episode of atrial fibrillation started: placed on amiodarone gtt and converted to NSR -continue carvedilol BID. -echo: showed EF of 20-25% with mitral regurgitation  ID -continue IV abx as prescribed: rocephin -follow up cultures:  -urine and blood cultures without growth -procalcitonin 4.12 -leukocytosis trending down: 31.3-->25.0   DVT: heparin GI: pepcid  TRANSFUSIONS AS NEEDED MONITOR FSBS ASSESS the need for LABS as needed   Critical Care Time devoted to patient care services described in this note is 30 minutes.   Overall, patient is critically ill, prognosis is guarded.  Patient with Multiorgan failure and at high risk for cardiac arrest and death.

## 2018-03-12 NOTE — Progress Notes (Signed)
FAMILY DISCUSSION  I had discussion with patient and family at bedside.  Patient with increased WOB with resp distress, he was taken off biPAP and placed on high flow [place] Patient is alert and awake, follows commands. Patient is AAOx3  I have updated family and patient about poor prognosis and patient with multiorgan failure. He understands his medical condition.  The patient has states that he DOES NOT WANT BIPAP anymore and that he wants to go home.  I then proceeded to tell him that he would die without any treatment. The patient has stated that he was angry that family was not telling him anything and that he wants to go home and die.  I have confirmed with family that patient is a DNR/DNI and he DOES NOT WANT HEMODIALYSIS if he needs it. PATIENT has also REFUSED BLOOD DRAWS.  Patient WANTS to go home and WANTS TO DIE AT HOME.     I have suggested that we stop BiPAP and just use high flow [place] and not follow o2 sats.  The family is aware of conversation and the plan is as follows:  STOP BIPAP THERAPY STOP CHECKING OXYGEN SATS on MONITOR CONTINUE HIGH FLOW [place] WILL DISCUSS WITH PALLIATIVE CARE TEAM TO ASSESS HOSPICE AT HOME STOP CHECKING LABS-(PATIENT REFUSED)   Patient/Family are satisfied with Plan of action and management. All questions answered  Lucie LeatherKurian David Quintan Saldivar, M.D.  [facility]  Medical Director [facility] Medical Director [facility] Cardio-Pulmonary Department

## 2018-03-12 NOTE — Care Management (Signed)
RNCM met with patient, daughter, grandson and his sister to offer choice of LTAC. Booklets from both [facility] and [facility] provided to them for review. They will consider LTAC and call RNCM with decision today when patient's wife is available.

## 2018-03-12 NOTE — Progress Notes (Signed)
bipap removed from room per MD and patient request.

## 2018-03-12 NOTE — Progress Notes (Signed)
Had to place patient back on Bipap due to oxygen levels staying 85-86% on maximum Heated HFNC settings. Increased BiPap to 16/8 and 100% to get patients sat above 88%. Will continue to monitor.

## 2018-03-12 NOTE — Progress Notes (Signed)
Placed patient on Heated HFNC of 50l and 98%.

## 2018-03-12 NOTE — Progress Notes (Signed)
Per Dr. Belia HemanKasa there is not an o2 sat goal for the patient and to discontinue o2 sat monitor and do not check o2 sats.  RN also made Dr. Belia HemanKasa aware that patient refused lab draw at 1500 for potassium.  MD acknowledged.

## 2018-03-12 NOTE — Progress Notes (Deleted)
Sedation titrated down during shift and myoclonic jerk to left foot increased along with jerking movement to head.

## 2018-03-12 NOTE — Progress Notes (Signed)
Patient was started on Amiodarone gtt yesterday after episode of AFIB with RVR. Converted to ST around 1939 and is now in SR.  Continue to monitor.

## 2018-03-13 DIAGNOSIS — Z7189 Other specified counseling: Secondary | ICD-10-CM

## 2018-03-13 DIAGNOSIS — R0602 Shortness of breath: Secondary | ICD-10-CM

## 2018-03-13 DIAGNOSIS — Z515 Encounter for palliative care: Secondary | ICD-10-CM

## 2018-03-13 LAB — GLUCOSE, CAPILLARY
GLUCOSE-CAPILLARY: 127 mg/dL — AB (ref 70–99)
GLUCOSE-CAPILLARY: 455 mg/dL — AB (ref 70–99)
GLUCOSE-CAPILLARY: 267 mg/dL — AB (ref 70–99)
Glucose-Capillary: 156 mg/dL — ABNORMAL HIGH (ref 70–99)
Glucose-Capillary: 162 mg/dL — ABNORMAL HIGH (ref 70–99)
Glucose-Capillary: 403 mg/dL — ABNORMAL HIGH (ref 70–99)

## 2018-03-13 LAB — BASIC METABOLIC PANEL
Anion gap: 10 (ref 5–15)
BUN: 74 mg/dL — AB (ref 8–23)
CO2: 21 mmol/L — AB (ref 22–32)
CREATININE: 2.08 mg/dL — AB (ref 0.61–1.24)
Calcium: 8.4 mg/dL — ABNORMAL LOW (ref 8.9–10.3)
Chloride: 105 mmol/L (ref 98–111)
GFR calc Af Amer: 35 mL/min — ABNORMAL LOW (ref >60)
GFR calc non Af Amer: 31 mL/min — ABNORMAL LOW (ref >60)
Glucose, Bld: 334 mg/dL — ABNORMAL HIGH (ref 70–99)
Potassium: 5 mmol/L (ref 3.5–5.1)
SODIUM: 136 mmol/L (ref 135–145)

## 2018-03-13 LAB — CULTURE, BLOOD (ROUTINE X 2)
CULTURE: NO GROWTH
Culture: NO GROWTH
SPECIAL REQUESTS: ADEQUATE
Special Requests: ADEQUATE

## 2018-03-13 MED ORDER — INSULIN GLARGINE 100 UNIT/ML ~~LOC~~ SOLN
25.0000 [IU] | Freq: Two times a day (BID) | SUBCUTANEOUS | Status: DC
  Filled 2018-03-13 (×2): qty 0.25

## 2018-03-13 MED ORDER — FUROSEMIDE 10 MG/ML IJ SOLN
40.0000 mg | Freq: Once | INTRAMUSCULAR | Status: AC
  Administered 2018-03-13: 40 mg via INTRAVENOUS
  Filled 2018-03-13: qty 4

## 2018-03-13 MED ORDER — INSULIN ASPART 100 UNIT/ML ~~LOC~~ SOLN
5.0000 [IU] | SUBCUTANEOUS | Status: DC
  Administered 2018-03-13: 5 [IU] via SUBCUTANEOUS
  Filled 2018-03-13: qty 1

## 2018-03-13 MED ORDER — INSULIN ASPART 100 UNIT/ML ~~LOC~~ SOLN
10.0000 [IU] | Freq: Once | SUBCUTANEOUS | Status: AC
  Administered 2018-03-13: 10 [IU] via SUBCUTANEOUS
  Filled 2018-03-13: qty 1

## 2018-03-13 MED ORDER — INSULIN GLARGINE 100 UNIT/ML ~~LOC~~ SOLN
20.0000 [IU] | Freq: Two times a day (BID) | SUBCUTANEOUS | Status: DC
  Administered 2018-03-13: 20 [IU] via SUBCUTANEOUS
  Filled 2018-03-13 (×2): qty 0.2

## 2018-03-13 NOTE — Progress Notes (Signed)
[employer] [facility] - [place] [facility]   PATIENT NAME: Joseph RipaJohn Little    MR#:  [id number]  DATE OF BIRTH:  September 27, 1945  SUBJECTIVE:  Feels well. Eating and drinking like normal. Shortness of breath improving. Remains on HFNC.  REVIEW OF SYSTEMS:    ROS  CONSTITUTIONAL: No documented fever. Has fatigue, weakness. No weight gain, no weight loss.  EYES: No blurry or double vision.  ENT: No tinnitus. No postnasal drip.  RESPIRATORY: Decreased cough, wheeze, no hemoptysis. Has dyspnea.  CARDIOVASCULAR: No chest pain. No orthopnea. No palpitations. No syncope.  GASTROINTESTINAL: No nausea, no vomiting or diarrhea. No abdominal pain. No melena or hematochezia.  GENITOURINARY: No dysuria or hematuria.  ENDOCRINE: No polyuria or nocturia. No heat or cold intolerance.  HEMATOLOGY: No anemia. No bruising. No bleeding.  INTEGUMENTARY: No rashes. No lesions.  MUSCULOSKELETAL: No arthritis. No swelling. No gout.  NEUROLOGIC: No numbness, tingling, or ataxia. No seizure-type activity.  PSYCHIATRIC: No anxiety. No insomnia. No ADD.  DRUG ALLERGIES:  No Known Allergies  VITALS:  Blood pressure (!) 164/77, pulse 96, temperature 98 F (36.7 C), temperature source Oral, resp. rate (!) 24, height 6\' 1"  (1.854 m), weight 83.4 kg (183 lb 13.8 oz), SpO2 92 %. PHYSICAL EXAMINATION:   Physical Exam  GENERAL:  72 y.o.-year-old patient sitting up in bed, eating lunch, with no acute distress.  EYES: Pupils equal, round, reactive to light and accommodation. No scleral icterus. Extraocular muscles intact.  HEENT: Head atraumatic, normocephalic. Oropharynx and nasopharynx clear.  NECK:  Supple, no jugular venous distention. No thyroid enlargement, no tenderness.  LUNGS: Decreased breath sounds bilaterally, mild expiratory wheezing, no crackles. No use of accessory muscles of respiration. HFNC in place. CARDIOVASCULAR: S1, S2 normal. No murmurs, rubs, or gallops.  ABDOMEN: Soft, nontender,  nondistended. Bowel sounds present. No organomegaly or mass.  EXTREMITIES: No cyanosis, clubbing or edema b/l.    NEUROLOGIC: Cranial nerves II through XII are intact. No focal weakness. PSYCHIATRIC: The patient is alert and oriented x 3.  SKIN: No obvious rash, lesion, or ulcer.  LABORATORY PANEL:   CBC Recent Labs  Lab 03/11/18 1109  WBC 25.0*  HGB 11.1*  HCT 32.8*  PLT 162   ------------------------------------------------------------------------------------------------------------------ Chemistries  Recent Labs  Lab 03/13/18 1544  NA 136  K 5.0  CL 105  CO2 21*  GLUCOSE 334*  BUN 74*  CREATININE 2.08*  CALCIUM 8.4*   ------------------------------------------------------------------------------------------------------------------  Cardiac Enzymes Recent Labs  Lab 03/08/18 1422  TROPONINI 0.19*   ------------------------------------------------------------------------------------------------------------------  RADIOLOGY:  No results found. ASSESSMENT AND PLAN:  72 year old male patient with history of hypertension, CVA, diabetes mellitus, tobacco abuse currently in ICU for respiratory failure.  * Acute hypoxic respiratory failure - on HFNC, BiPAP prn - palliative care consulted- will likely be going home with hospice - nebulizers prn - on IV solumedrol - per CCM   * Acute pulmonary edema - echocardiogram shows EF 20-25% - Diuretics as needed per PCCM  * New onset COPD with exacerbation  - continue ceftriaxone - continue IV Solu-Medrol and nebulizers prn - blood cultures negative  * Acute kidney injury- improving - Avoid nephrotoxic drugs - Renal ultrasound normal - Monitor renal function  *A-fib - started on amio gtt and converted to NSR - coreg bid  * DM: SSI & lantus 25 units SQ QHS  * Elevated troponin secondary to demand ischemia - monitor  * Anemia. No evidence of active bleeding - monitor  All the records are reviewed  and case  discussed with Care Management/Social Worker. Management plans discussed with the patient, nursing and they are in agreement.  CODE STATUS: DNR  DVT Prophylaxis: SCDs  TOTAL TIME TAKING CARE OF THIS PATIENT: 15 minutes.   POSSIBLE D/C IN 2 to 3 DAYS, DEPENDING ON CLINICAL CONDITION.  Jinny Blossom Rana Adorno M.D on 03/13/2018 at 4:39 PM  Between 7am to 6pm - Pager - [phone]  After 6pm go to [email or web address] - password [email or web address] [facility]  [facility]  Office  [phone]  CC: Primary care physician; Gracelyn Nurse, MD  Note: This dictation was prepared with Dragon dictation along with smaller phrase technology. Any transcriptional errors that result from this process are unintentional.

## 2018-03-13 NOTE — Consult Note (Signed)
Consultation Note Date: 03/13/2018   Patient Name: Joseph Little  DOB: [birth year]  MRN: [id number]  Age / Sex: 72 y.o., male  PCP: Gracelyn Nurse, MD Referring Physician: Campbell Stall, MD  Reason for Consultation: Establishing goals of care  HPI/Patient Profile: 72 y.o. male  with past medical history of hypertension, CVA, diabetes mellitus, tobacco abuse admitted on 03/08/2018 with respiratory distress with new onset COPD exacerbation as well as EF 20-25% with atrial fibrillation complicated by acute renal failure.   Clinical Assessment and Goals of Care: I had a long discussion initially with family as Joseph Little slept. Family is very anxious about plan to get him home and have concerns about what this would look like and concerns that Joseph Little does not understand what this could look like. We discussed multiple scenarios including getting him home with hospice (more difficult with high oxygen needs), decreasing/weaning oxygen slowly here with goal to get home (with hopes of easier transition to home), and also if declines while weaning oxygen the possibility of transition to comfort here and inability to to get him home. Family mostly concerned about level of suffering when he returns home and worried he does not understand how difficult this transition could be and how poor his prognosis would be. We thoroughly discussed how our conversation with him should go and they request Dr. Belia Heman to also participate if possible.   Late entry: I return later today and Joseph Little is awake so I gathered family and appreciate Dr. Clovis Fredrickson participation as we discussed with Joseph Little a plan. We discussed that we will continue to do everything possible with the goal to get him home (he confirmed this is his main goal). I explained that we need to wean his oxygen down in order to be able to get him home and he understands. I  further asked his hopes or goal in returning home and he became very tearful saying he just wants to sit in his living room and be with his family. He understands that this could possibly be for a very short time and that he could decline and die within hours potentially but still desires to return home. Explained this will take coordination with his oxygen as well as with hospice to help to make sure we can control his symptoms so that he does not suffer with breathing, anxiety, or pain. He understands. Family tearful. His oxygen needs are decreasing which will make transition home much more practical and possible.   All questions/concerns addressed. Provided family any time for feedback or additional issues to discuss. Emotional support provided.   Primary Decision Maker PATIENT    SUMMARY OF RECOMMENDATIONS   - DNR - Wean oxygen with ultimate goal to return home with hospice care  Code Status/Advance Care Planning:  DNR   Symptom Management:   Anxiety: Xanax 0.5 mg qhs prn. Would liberalize prn to every 4 hours prn upon return home.   SOB: Morphine 2 mg IV every hour prn. Would recommend roxanol SL/po  5-10 mg every hour prn for home management.   Palliative Prophylaxis:   Aspiration, Delirium Protocol, Frequent Pain Assessment and Turn Reposition  Additional Recommendations (Limitations, Scope, Preferences):  Full Comfort Care  Psycho-social/Spiritual:   Desire for further Chaplaincy support:yes  Additional Recommendations: Caregiving  Support/Resources, Education on Hospice and Grief/Bereavement Support  Prognosis:   < 2 weeks   Discharge Planning: Home with Hospice      Primary Diagnoses: Present on Admission: . Respiratory failure (HCC)   I have reviewed the medical record, interviewed the patient and family, and examined the patient. The following aspects are pertinent.  Past Medical History:  Diagnosis Date  . Diabetes mellitus without complication (HCC)    . Hypertension    Social History   Socioeconomic History  . Marital status: Married    Spouse name: Not on file  . Number of children: Not on file  . Years of education: Not on file  . Highest education level: Not on file  Occupational History  . Not on file  Social Needs  . Financial resource strain: Not on file  . Food insecurity:    Worry: Not on file    Inability: Not on file  . Transportation needs:    Medical: Not on file    Non-medical: Not on file  Tobacco Use  . Smoking status: Current Every Day Smoker  . Smokeless tobacco: Never Used  Substance and Sexual Activity  . Alcohol use: No    Frequency: Never  . Drug use: No  . Sexual activity: Not on file  Lifestyle  . Physical activity:    Days per week: Not on file    Minutes per session: Not on file  . Stress: Not on file  Relationships  . Social connections:    Talks on phone: Not on file    Gets together: Not on file    Attends religious service: Not on file    Active member of club or organization: Not on file    Attends meetings of clubs or organizations: Not on file    Relationship status: Not on file  Other Topics Concern  . Not on file  Social History Narrative  . Not on file   No family history on file. Scheduled Meds: . amiodarone  200 mg Oral BID  . amLODipine  10 mg Oral Daily  . aspirin EC  325 mg Oral Daily  . atorvastatin  40 mg Oral q1800  . budesonide (PULMICORT) nebulizer solution  0.5 mg Nebulization BID  . carvedilol  6.25 mg Oral BID WC  . chlorhexidine  15 mL Mouth Rinse BID  . docusate sodium  100 mg Oral BID  . famotidine  20 mg Oral Daily  . guaiFENesin  600 mg Oral BID  . heparin  5,000 Units Subcutaneous Q8H  . insulin aspart  0-20 Units Subcutaneous Q4H  . insulin glargine  25 Units Subcutaneous QHS  . ipratropium-albuterol  3 mL Nebulization Q4H  . mouth rinse  15 mL Mouth Rinse q12n4p  . methylPREDNISolone (SOLU-MEDROL) injection  40 mg Intravenous Q12H  . nicotine   14 mg Transdermal Daily  . sodium chloride flush  3 mL Intravenous Q12H  . sodium chloride flush  3 mL Intravenous Q12H  . tamsulosin  0.4 mg Oral Daily   Continuous Infusions: . sodium chloride    . cefTRIAXone (ROCEPHIN)  IV Stopped (03/13/18 1101)  . dexmedetomidine (PRECEDEX) IV infusion Stopped (03/12/18 0810)   PRN Meds:.sodium chloride, acetaminophen **OR**  acetaminophen, ALPRAZolam, HYDROcodone-acetaminophen, morphine injection, ondansetron **OR** [DISCONTINUED] ondansetron (ZOFRAN) IV, polyethylene glycol, sodium chloride flush No Known Allergies Review of Systems  Constitutional: Positive for activity change and fatigue. Negative for appetite change.  Respiratory: Positive for shortness of breath.   Neurological: Positive for weakness.  Psychiatric/Behavioral: Positive for sleep disturbance.    Physical Exam  Constitutional: He is oriented to person, place, and time. He appears well-developed.  HENT:  Head: Normocephalic and atraumatic.  Cardiovascular: Normal rate.  Pulmonary/Chest: No accessory muscle usage. No tachypnea.  Mild distress  Abdominal: Normal appearance.  Neurological: He is alert and oriented to person, place, and time.  Psychiatric:  Appropriately tearful and anxious at times  Nursing note and vitals reviewed.   Vital Signs: BP (!) 158/67   Pulse 96   Temp 98 F (36.7 C) (Oral)   Resp 13   Ht 6\' 1"  (1.854 m)   Wt 83.4 kg (183 lb 13.8 oz)   SpO2 93%   BMI 24.26 kg/m  Pain Scale: 0-10   Pain Score: 0-No pain   SpO2: SpO2: 93 % O2 Device:SpO2: 93 % O2 Flow Rate: .O2 Flow Rate (L/min): 50 L/min  IO: Intake/output summary:   Intake/Output Summary (Last 24 hours) at 03/13/2018 1305 Last data filed at 03/13/2018 1101 Gross per 24 hour  Intake 100 ml  Output 3545 ml  Net -3445 ml    LBM: Last BM Date: 03/08/18 Baseline Weight: Weight: 81.6 kg (180 lb) Most recent weight: Weight: 83.4 kg (183 lb 13.8 oz)     Palliative  Assessment/Data: 30%     Time Total: 75 min  Greater than 50%  of this time was spent counseling and coordinating care related to the above assessment and plan.  Signed by: Yong Channel, NP Palliative Medicine Team Pager # [phone] (M-F 8a-5p) Team Phone # [phone] (Nights/Weekends)

## 2018-03-13 NOTE — Progress Notes (Signed)
RN made Elvina SidleKeene, NP aware that blood glucose recheck was 455.  NP acknowledged and gave no new orders at this time.

## 2018-03-13 NOTE — Care Management (Addendum)
RNCM stopped by patient's daughter that what expressing some anxiety about her father. She shared that she doesn't believer he understands what MD told him yesterday- regarding poor prognosis. Patient refuses Bipap and blood labs however he wants to go home. He talks about dying at home and then talks about getting back to his normal life.  His wife said he sat around a lot at baseline however he was still driving himself. His wife also agrees that patient is either avoiding death discussion or that he doesn't understand that if he doesn't allow labs and bipap if needed- he may not improve.  Family has requested meeting with patient and MD to make sure he understands.  LTAC on hold per MD request until meeting with patient. RNCM discuss hospice at home with family- but not patient.  Patient has capacity and I encouraged wife and daughter to talk to patient about what he wants. Daughter said "he feels good and eating good".  Update at 1530: RNCM spoke with MD and LTAC not needed at this time. I have updated both [facility] and [facility].

## 2018-03-13 NOTE — Progress Notes (Signed)
CRITICAL CARE NOTE  CC  follow up respiratory failure secondary to COPD and CHF  SUBJECTIVE Patient remains critically ill Prognosis is guarded  72 year old male admitted on 8/1 for acute hypoxic respiratory failure with COPD and CHF exacerbation. Patient states he is feeling good this morning. Complains of continued SOB, but states that it is no more than usual. Denies cough, fever/chills. States he did not sleep well last night and is ready to go home.    SIGNIFICANT EVENTS Long discussion was had with family, patient, and critical care team. Patient has declined the use of BiPAP and labs. Patient would like to be transported home and stated he was ready to pass.   BP (!) 168/67   Pulse 96   Temp 98.1 F (36.7 C) (Oral)   Resp 20   Ht 6\' 1"  (1.854 m)   Wt 83.4 kg (183 lb 13.8 oz)   SpO2 91%   BMI 24.26 kg/m    REVIEW OF SYSTEMS  GENERAL: -fever/chills, +fatigue HEENT: -headache, -eye pain, -eye discharge -ear pain, -ear discharge, -congestion, -sore throat,  PULMONARY: +shortness of breath, -cough, -sputum production, -hemoptysis, -wheezing CARDIOVASCULAR: -chest pain, -palpitations, -orthopnea, -PND, -edema GASTROINTESTINAL: -abdominal pain, -nausea, -vomiting, -diarrhea, -constipation MUSCULOSKELETAL:-myalgias, -joint pain NEUROLOGIC -dizziness, -lightheadedness, -LOC, -weakness SKIN: -itching, -rash   PHYSICAL EXAMINATION:  GENERAL:critically ill appearing, +resp distress HEAD: Normocephalic, atraumatic.  EYES: Pupils equal, round, reactive to light.  No scleral icterus.  MOUTH: Moist mucosal membrane. NECK: Supple. No thyromegaly. No nodules. No JVD.  PULMONARY: clear to auscultation bilaterally, on HFNC CARDIOVASCULAR: S1 and S2. Regular rate and rhythm. No murmurs, rubs, or gallops.  GASTROINTESTINAL: Soft, nontender, -distended. No masses. Positive bowel sounds. No hepatosplenomegaly.  MUSCULOSKELETAL: No swelling, clubbing, or edema.  NEUROLOGIC: alert and  oriented, asking to go home SKIN:intact,warm,dry  ASSESSMENT AND PLAN SYNOPSIS  72 year old male with history of hypertension, DM, and CVA who was admitted on 8/1 for acute hypoxic respiratory failure. Patient is status DO NOT RESUSCITATE. Long discussion with patient and family yesterday, revealed patient wishes to be transferred home for hospice care. Working with palliative care to make these wishes happen.    Severe Hypoxic and Hypercapnic Respiratory Failure secondary to COPD exacerbation -continue HFNC -BIPAP discontinued yesterday -O2 SAT monitor discontinued yesterday   Renal Failure -creatinine elevated -the patient has refused further lab draws -the patient has declined hemodialysis if needed.  ENDOCRINOLOGY: -diabetes mellitus -continue SSI -continue lantus QHS  CARDIAC -patient has episode of atrial fibrillation on Sunday (8/4) -continue oral carvedilol, amiodarone, amlodipine  ID -continue IV abx as prescribed: ceftriaxone -follow up cultures: no growth x 5 days   DVT: heparin GI PRX: pepcid  TRANSFUSIONS AS NEEDED MONITOR FSBS ASSESS the need for LABS as needed   Critical Care Time devoted to patient care services described in this note is 33 minutes.   Overall, patient is critically ill, prognosis is guarded.  Patient with Multiorgan failure and at high risk for cardiac arrest and death.

## 2018-03-13 NOTE — Progress Notes (Signed)
CRITICAL CARE NOTE  CC  follow up respiratory failure secondary to COPD and CHF  SUBJECTIVE Patient remains critically ill Prognosis is guarded  72 year old male admitted on 8/1 for acute hypoxic respiratory failure with COPD and CHF exacerbation. Patient states he is feeling good this morning. Complains of continued SOB, but states that it is no more than usual. Denies cough, fever/chills. States he did not sleep well last night and is ready to go home.    SIGNIFICANT EVENTS Long discussion was had with family, patient, and critical care team. Patient has declined the use of BiPAP and labs. Patient would like to be transported home and stated he was ready to pass.  Plan for palliative care consultation and assess for home with hospice  BP (!) 168/67   Pulse 96   Temp 98.1 F (36.7 C) (Oral)   Resp 20   Ht 6\' 1"  (1.854 m)   Wt 183 lb 13.8 oz (83.4 kg)   SpO2 91%   BMI 24.26 kg/m    REVIEW OF SYSTEMS  GENERAL: -fever/chills, +fatigue HEENT: -headache, -eye pain, -eye discharge -ear pain, -ear discharge, -congestion, -sore throat,  PULMONARY: +shortness of breath, -cough, -sputum production, -hemoptysis, -wheezing CARDIOVASCULAR: -chest pain, -palpitations, -orthopnea, -PND, -edema GASTROINTESTINAL: -abdominal pain, -nausea, -vomiting, -diarrhea, -constipation MUSCULOSKELETAL:-myalgias, -joint pain NEUROLOGIC -dizziness, -lightheadedness, -LOC, -weakness SKIN: -itching, -rash   PHYSICAL EXAMINATION:  GENERAL:critically ill appearing, +resp distress HEAD: Normocephalic, atraumatic.  EYES: Pupils equal, round, reactive to light.  No scleral icterus.  MOUTH: Moist mucosal membrane. NECK: Supple. No thyromegaly. No nodules. No JVD.  PULMONARY: clear to auscultation bilaterally, on HFNC CARDIOVASCULAR: S1 and S2. Regular rate and rhythm. No murmurs, rubs, or gallops.  GASTROINTESTINAL: Soft, nontender, -distended. No masses. Positive bowel sounds. No hepatosplenomegaly.   MUSCULOSKELETAL: No swelling, clubbing, or edema.  NEUROLOGIC: alert and oriented, asking to go home SKIN:intact,warm,dry  ASSESSMENT AND PLAN SYNOPSIS  72 year old male with history of hypertension, DM, and CVA who was admitted on 8/1 for acute hypoxic respiratory failure. Patient is status DO NOT RESUSCITATE. Long discussion with patient and family yesterday, revealed patient wishes to be transferred home for hospice care. Working with palliative care to make these wishes happen.    Severe Hypoxic and Hypercapnic Respiratory Failure secondary to COPD exacerbation -continue HFNC -BIPAP discontinued yesterday -O2 SAT monitor discontinued yesterday   Renal Failure -creatinine elevated -the patient has refused further lab draws -the patient has declined hemodialysis if needed. Will stop drawing labs as patient refused  ENDOCRINOLOGY: -diabetes mellitus -continue SSI -continue lantus QHS  CARDIAC -patient has episode of atrial fibrillation on Sunday (8/4) -continue oral carvedilol, amiodarone, amlodipine  ID -continue IV abx as prescribed: ceftriaxone -follow up cultures: no growth x 5 days   DVT: heparin GI PRX: pepcid  TRANSFUSIONS AS NEEDED MONITOR FSBS ASSESS the need for LABS as needed   Critical Care Time devoted to patient care services described in this note is 33 minutes.   Overall, patient is critically ill, prognosis is guarded.  Patient with Multiorgan failure and at high risk for cardiac arrest and death.     Lucie LeatherKurian David Norman Bier, M.D.  [facility]  Medical Director [facility] Medical Director [facility] Card[facility]

## 2018-03-13 NOTE — Progress Notes (Signed)
RN discussed finger stick glucose of 403 with Joseph SidleKeene, NP and he gave order to give 20 units of sliding scale insulin and recheck blood glucose in 1 hour.

## 2018-03-14 LAB — GLUCOSE, CAPILLARY
GLUCOSE-CAPILLARY: 239 mg/dL — AB (ref 70–99)
GLUCOSE-CAPILLARY: 184 mg/dL — AB (ref 70–99)
GLUCOSE-CAPILLARY: 221 mg/dL — AB (ref 70–99)
Glucose-Capillary: 131 mg/dL — ABNORMAL HIGH (ref 70–99)
Glucose-Capillary: 53 mg/dL — ABNORMAL LOW (ref 70–99)
Glucose-Capillary: 62 mg/dL — ABNORMAL LOW (ref 70–99)

## 2018-03-14 MED ORDER — FUROSEMIDE 10 MG/ML IJ SOLN
40.0000 mg | Freq: Once | INTRAMUSCULAR | Status: AC
  Administered 2018-03-14: 40 mg via INTRAVENOUS
  Filled 2018-03-14: qty 4

## 2018-03-14 MED ORDER — INSULIN ASPART 100 UNIT/ML ~~LOC~~ SOLN
0.0000 [IU] | Freq: Three times a day (TID) | SUBCUTANEOUS | Status: DC
  Administered 2018-03-14: 5 [IU] via SUBCUTANEOUS
  Filled 2018-03-14: qty 1

## 2018-03-14 MED ORDER — CARVEDILOL 6.25 MG PO TABS: 6.2500 mg | ORAL_TABLET | Freq: Two times a day (BID) | ORAL | 0 refills | Status: AC

## 2018-03-14 MED ORDER — LORAZEPAM 0.5 MG PO TABS: 0.5000 mg | ORAL_TABLET | ORAL | 0 refills | Status: AC | PRN

## 2018-03-14 MED ORDER — MORPHINE SULFATE (CONCENTRATE) 10 MG /0.5 ML PO SOLN: 5.0000 mg | ORAL | 0 refills | Status: AC | PRN

## 2018-03-14 MED ORDER — INSULIN ASPART 100 UNIT/ML ~~LOC~~ SOLN: 0.0000 [IU] | Freq: Every day | SUBCUTANEOUS | Status: DC

## 2018-03-14 MED ORDER — PREDNISONE 20 MG PO TABS
20.0000 mg | ORAL_TABLET | Freq: Every day | ORAL | Status: DC
  Administered 2018-03-14: 20 mg via ORAL

## 2018-03-14 MED ORDER — INSULIN GLARGINE 100 UNIT/ML ~~LOC~~ SOLN
25.0000 [IU] | Freq: Every day | SUBCUTANEOUS | Status: DC
  Filled 2018-03-14: qty 0.25

## 2018-03-14 MED ORDER — PREDNISONE 10 MG PO TABS: 20.0000 mg | ORAL_TABLET | Freq: Every day | ORAL | Status: DC

## 2018-03-14 MED ORDER — ONDANSETRON HCL 4 MG PO TABS: 4.0000 mg | ORAL_TABLET | Freq: Four times a day (QID) | ORAL | 0 refills | Status: AC | PRN

## 2018-03-14 MED ORDER — CEFDINIR 300 MG PO CAPS: 300.0000 mg | ORAL_CAPSULE | Freq: Two times a day (BID) | ORAL | Status: DC

## 2018-03-14 MED ORDER — POLYETHYLENE GLYCOL 3350 17 G PO PACK: 17.0000 g | PACK | Freq: Every day | ORAL | 0 refills | Status: AC | PRN

## 2018-03-14 MED ORDER — METFORMIN HCL 500 MG PO TABS
1000.0000 mg | ORAL_TABLET | Freq: Two times a day (BID) | ORAL | Status: DC
  Administered 2018-03-14: 1000 mg via ORAL
  Filled 2018-03-14 (×2): qty 2

## 2018-03-14 MED ORDER — IPRATROPIUM-ALBUTEROL 0.5-2.5 (3) MG/3ML IN SOLN: 3.0000 mL | RESPIRATORY_TRACT | 0 refills | Status: AC

## 2018-03-14 MED ORDER — GLIPIZIDE 5 MG PO TABS
5.0000 mg | ORAL_TABLET | Freq: Two times a day (BID) | ORAL | Status: DC
  Filled 2018-03-14: qty 1

## 2018-03-14 NOTE — Progress Notes (Signed)
Palliative:  I met today with Joseph Little and wife and brother at bedside. Discussed with them more about goals and best home options to fit their goals. Joseph Little confirmed his desire to return home ASAP and continues to desire to go home, stay at home, and focus on his comfort while he lives out the rest of his life in his home with his family. I explained that given his condition today and oxygen needs this is a much easier goal to reach. We discussed at length hospice services at home and they agree this will better fit their needs and goals. Discussed medications to use to ensure comfort (roxanol and SL ativan) and equipment to be provided by hospice (oxygen, neb machine, bed). All questions/concerns addressed. Emotional support provided.   Will need prescriptions for home hospice:  - Roxanol 5 mg every 2 hours prn SOB/severe pain.  - Xanax po 0.5 mg every 4 hours prn.  - Medications for comfort to be managed and titrated at home to achieve comfort by hospice.  - Will need nebs per PCCM recommendation.  - Other medications per primary/PCCM recommendations.   Exam: Alert, oriented, no distress. "I feel good." 4L oxygen.   2 min  Vinie Sill, NP Palliative Medicine Team Pager # [phone] (M-F 8a-5p) Team Phone # [phone] (Nights/Weekends)

## 2018-03-14 NOTE — Progress Notes (Signed)
New referral for [facility] services at home received from [place] following a Palliative medicine consult. Patient is a 72 year old man with a history of with past medical history of hypertension, CVA, diabetes mellitus, tobacco abuse admitted on 03/08/2018 with respiratory distress with new onset COPD exacerbation as well as EF 20-25% with atrial fibrillation complicated by acute renal failure. Palliative medicine was consulted for goals of care and met with patient and family yesterday. They have chosen to discharge home with the support of hospice services.  Writer met in the room with patient and his wife Joseph Little and brother to initiate education regarding hospice services, philosophy and team approach to care with understanding voiced. Questions answered. DME requested and ordered for delivery as soon as possible, plan is for EMS transport when DME has been delivered. Hospital care team updated. All patient information faxed to referral. Signed DNR in place in discharge packet. Thank you for the opportunity to be involved in the care of this patient and her family. Flo Shanks RN, BSN, [facility] [place], hospital Liaison [phone]

## 2018-03-14 NOTE — Progress Notes (Signed)
Inpatient Diabetes Program Recommendations  AACE/ADA: New Consensus Statement on Inpatient Glycemic Control (2015)  Target Ranges:  Prepandial:   less than 140 mg/dL      Peak postprandial:   less than 180 mg/dL (1-2 hours)      Critically ill patients:  140 - 180 mg/dL  Results for [name], Fahim (MRN [id number]) as of 03/14/2018 07:09  Ref. Range 03/13/2018 07:21 03/13/18 10:20 03/13/2018 11:24 03/13/2018 16:34 03/13/2018 17:49 03/13/2018 20:15 03/13/18 22:04 03/14/2018 00:25 03/14/2018 00:44 03/14/2018 01:25 03/14/2018 04:03  Glucose-Capillary Latest Ref Range: 70 - 99 mg/dL 409127 (H)  Novolog 3 units   Solumedrol 40 mg 162 (H) 403 (H)  Novolog 20 units @ 16:48 455 (H)  Novolog 10 units @ 18:47 267 (H)  Novolog 16 units @ 20:21   Lantus 20 units @22 :04  Solumedrol 40 mg @ 22:13 53 (L) 62 (L) 131 (H) 239 (H)   Novolog 7 units    Review of Glycemic Control  Diabetes history: DM2 Outpatient Diabetes medications: Glipizide 5 mg BID, Metformin 1000 mg BID Current orders for Inpatient glycemic control: Lantus 25 units daily, Novolog 0-20 units Q4H; Solumedrol 40 mg Q12H  Inpatient Diabetes Program Recommendations:  Insulin-SQ: If steroids are continued, do not recommend changing SQ insulin orders at this time. Diet: If appropriate, please consider changing diet from Regular to Carb Modified.  NOTE: Noted hypoglycemia this morning at 00:25. Hypoglycemia likely from patient getting a total of Novolog 46 units within a 4 hour time frame prior to hypoglycemia event. It is not recommended to give Novolog insulin any closer than Q4H due to onset and duration.   Thanks, Orlando PennerMarie Kynnedy Carreno, RN, MSN, CDE Diabetes Coordinator Inpatient Diabetes Program [phone] (Team Pager from 8am to 5pm)

## 2018-03-14 NOTE — Discharge Summary (Signed)
[facility] [place] [facility]   PATIENT NAME: Joseph Little    MR#:  [id number]  DATE OF BIRTH:  1945-12-06  DATE OF ADMISSION:  03/08/2018   ADMITTING PHYSICIAN: Bertrum Sol, MD  DATE OF DISCHARGE: 03/14/18  PRIMARY CARE PHYSICIAN: Gracelyn Nurse, MD   ADMISSION DIAGNOSIS:  SOB (shortness of breath) [R06.02] COPD exacerbation (HCC) [J44.1] Acute congestive heart failure, unspecified heart failure type (HCC) [I50.9] DISCHARGE DIAGNOSIS:  Active Problems:   Respiratory failure (HCC)   SOB (shortness of breath)   Goals of care, counseling/discussion   Palliative care encounter  SECONDARY DIAGNOSIS:   Past Medical History:  Diagnosis Date  . Diabetes mellitus without complication (HCC)   . Hypertension    HOSPITAL COURSE:   Joseph Little is a 72 year old male with a PMH of HTN and T2DM who presented to the ED with shortness of breath. In the ED, he was cyanotic and hypoxic with O2 saturations in the 40s. CXR showed pulmonary edema. Patient was initially on nonrebreather, but was changed to BiPAP. He was admitted to stepdown for acute hypoxic respiratory failure secondary to COPD and CHF exacerbations. He was treated with nebulizers, IV steroids, IV lasix, and IV ceftriaxone/azithromycin. He was able to be weaned to HFNC and then to 4L O2 by [place]. A family discussion was held between patient, patient's family, CCM, and palliative care. A decision was made to discharge patient to home with hospice care.  DISCHARGE CONDITIONS:  stable CONSULTS OBTAINED:  CCM Palliative care DRUG ALLERGIES:  No Known Allergies DISCHARGE MEDICATIONS:   Allergies as of 03/14/2018   No Known Allergies     Medication List    STOP taking these medications   aspirin EC 325 MG tablet   atorvastatin 40 MG tablet Commonly known as:  LIPITOR   glipiZIDE 5 MG tablet Commonly known as:  GLUCOTROL     TAKE these medications   amLODipine 10 MG tablet Commonly known as:   NORVASC Take 1 tablet (10 mg total) by mouth daily.   carvedilol 6.25 MG tablet Commonly known as:  COREG Take 1 tablet (6.25 mg total) by mouth 2 (two) times daily with a meal.   ipratropium-albuterol 0.5-2.5 (3) MG/3ML Soln Commonly known as:  DUONEB Take 3 mLs by nebulization every 4 (four) hours.   lisinopril 5 MG tablet Commonly known as:  PRINIVIL,ZESTRIL Take 5 mg by mouth daily.   LORazepam 0.5 MG tablet Commonly known as:  ATIVAN Take 1 tablet (0.5 mg total) by mouth every 4 (four) hours as needed for anxiety.   metFORMIN 1000 MG tablet Commonly known as:  GLUCOPHAGE Take 1 tablet (1,000 mg total) by mouth 2 (two) times daily with a meal.   morphine CONCENTRATE 10 mg / 0.5 ml concentrated solution Take 0.25 mLs (5 mg total) by mouth every 2 (two) hours as needed for severe pain.   ondansetron 4 MG tablet Commonly known as:  ZOFRAN Take 1 tablet (4 mg total) by mouth every 6 (six) hours as needed for nausea.   polyethylene glycol packet Commonly known as:  MIRALAX / GLYCOLAX Take 17 g by mouth daily as needed for mild constipation.        DISCHARGE INSTRUCTIONS:  1. Hospice to follow at home DIET:  Regular diet DISCHARGE CONDITION:  Stable ACTIVITY:  Activity as tolerated OXYGEN:  Home Oxygen: Yes.    Oxygen Delivery: 4 liters/min via Patient connected to nasal cannula oxygen DISCHARGE LOCATION:  home  with hospice  If you experience worsening of your admission symptoms, develop shortness of breath, life threatening emergency, suicidal or homicidal thoughts you must seek medical attention immediately by calling 911 or calling your MD immediately  if symptoms less severe.  You Must read complete instructions/literature along with all the possible adverse reactions/side effects for all the Medicines you take and that have been prescribed to you. Take any new Medicines after you have completely understood and accpet all the possible adverse reactions/side  effects.   Please note  You were cared for by a hospitalist during your hospital stay. If you have any questions about your discharge medications or the care you received while you were in the hospital after you are discharged, you can call the unit and asked to speak with the hospitalist on call if the hospitalist that took care of you is not available. Once you are discharged, your primary care physician will handle any further medical issues. Please note that NO REFILLS for any discharge medications will be authorized once you are discharged, as it is imperative that you return to your primary care physician (or establish a relationship with a primary care physician if you do not have one) for your aftercare needs so that they can reassess your need for medications and monitor your lab values.    On the day of Discharge:  VITAL SIGNS:  Blood pressure 139/66, pulse (!) 45, temperature (!) 97.4 F (36.3 C), temperature source Oral, resp. rate (!) 21, height 6\' 1"  (1.854 m), weight 83.4 kg (183 lb 13.8 oz), SpO2 99 %. PHYSICAL EXAMINATION:  GENERAL:  72 y.o.-year-old patient lying in the bed with no acute distress. Thin-appearing. EYES: Pupils equal, round, reactive to light and accommodation. No scleral icterus. Extraocular muscles intact.  HEENT: Head atraumatic, normocephalic. Oropharynx and nasopharynx clear.  NECK:  Supple, no jugular venous distention. No thyroid enlargement, no tenderness.  LUNGS: normal work of breathing, no accessory muscle use, decreased air movement throughout all lung fields, no wheezing or crackles, [place] in place CARDIOVASCULAR: S1, S2 normal. No murmurs, rubs, or gallops.  ABDOMEN: Soft, non-tender, non-distended. Bowel sounds present. No organomegaly or mass.  EXTREMITIES: No pedal edema, cyanosis, or clubbing.  NEUROLOGIC: Cranial nerves II through XII are intact. Muscle strength 5/5 in all extremities. Sensation intact. Gait not checked.  PSYCHIATRIC: The patient  is alert and oriented x 3.  SKIN: No obvious rash, lesion, or ulcer.  DATA REVIEW:   CBC Recent Labs  Lab 03/11/18 1109  WBC 25.0*  HGB 11.1*  HCT 32.8*  PLT 162    Chemistries  Recent Labs  Lab 03/13/18 1544  NA 136  K 5.0  CL 105  CO2 21*  GLUCOSE 334*  BUN 74*  CREATININE 2.08*  CALCIUM 8.4*     Microbiology Results  Results for orders placed or performed during the hospital encounter of 03/08/18  Blood Culture (routine x 2)     Status: None   Collection Time: 03/08/18  4:03 PM  Result Value Ref Range Status   Specimen Description BLOOD RIGHT ANTECUBITAL  Final   Special Requests   Final    BOTTLES DRAWN AEROBIC AND ANAEROBIC Blood Culture adequate volume   Culture   Final    NO GROWTH 5 DAYS Performed at [facility], [address], [place], [place] [address]    Report Status 03/13/2018 FINAL  Final  Blood Culture (routine x 2)     Status: None   Collection Time:  03/08/18  4:03 PM  Result Value Ref Range Status   Specimen Description BLOOD BLOOD LEFT HAND  Final   Special Requests   Final    BOTTLES DRAWN AEROBIC AND ANAEROBIC Blood Culture adequate volume   Culture   Final    NO GROWTH 5 DAYS Performed at [facility], [address] [place], [place] [address]    Report Status 03/13/2018 FINAL  Final  Urine Culture     Status: None   Collection Time: 03/10/18  8:34 AM  Result Value Ref Range Status   Specimen Description   Final    URINE, RANDOM Performed at [facility], [address] [place], [place] [address]    Special Requests   Final    NONE Performed at [facility], [address] [place], [place] [address]    Culture   Final    NO GROWTH Performed at [facility], [address] [place], [place] [address]    Report Status 03/11/2018 FINAL  Final  MRSA PCR Screening     Status: None   Collection Time: 03/12/18  8:29 AM  Result Value Ref Range Status   MRSA by PCR NEGATIVE NEGATIVE  Final    Comment:        The GeneXpert MRSA Assay (FDA approved for NASAL specimens only), is one component of a comprehensive MRSA colonization surveillance program. It is not intended to diagnose MRSA infection nor to guide or monitor treatment for MRSA infections. Performed at [facility], [address] [place], [place] [address]     RADIOLOGY:  No results found.   Management plans discussed with the patient, family and they are in agreement.  CODE STATUS: DNR   TOTAL TIME TAKING CARE OF THIS PATIENT: 35 minutes.    Jinny BlossomKaty D Kataleia Quaranta M.D on 03/14/2018 at 3:56 PM  Between 7am to 6pm - Pager - [phone]  After 6pm go to [email or web address] - Social research officer, governmentpassword [email or web address] [email or web address]  [facility] [place] [facility]  Office  [phone]  CC: Primary care physician; Gracelyn NurseJohnston, Kazuma D, MD   Note: This dictation was prepared with Dragon dictation along with smaller phrase technology. Any transcriptional errors that result from this process are unintentional.

## 2018-03-14 NOTE — Care Management (Signed)
Discharge to home today per Dr. Nancy MarusMayo. Will be followed in the home by [facility]. [facility] will transport after equipment is delivered to the home. Gwenette GreetBrenda S Maddix Kliewer RN MSN CCM Care Management [phone]

## 2018-03-14 NOTE — Progress Notes (Signed)
Patient discharged home with hospice via non emergent [facility]. Patient discharged with foley intact and on 4 L 02. Prescriptions given to patients wife. Patient in no distress. Otilio JeffersonMadelyn S Fenton, RN

## 2018-03-14 NOTE — Care Management (Signed)
RNCM contacted by Palliative care team regarding patient's desire to go home with hospice and that patient would like to use [facility] at home.  Clydie BraunKaren with [facility] is aware and working on DME with patient.

## 2018-03-14 NOTE — Progress Notes (Signed)
CRITICAL CARE NOTE  CC  follow up respiratory failure secondary to COPD and CHF  SUBJECTIVE Patient remains critically ill Prognosis is guarded  72 year old male admitted on 8/1 for acute hypoxic respiratory failure with COPD and CHF exacerbation. This morning, patient tells me he is feeling terrible because he cannot go home. He endorses continued SOB, but none worse than his previous days. He again states he did not sleep well last night, but he did take a nap during the day yesterday. Patient took HFNC off himself yesterday and was transferred to nasal cannula without problem.    SIGNIFICANT EVENTS Discussion was held between patient, family, palliative care team, and critical care team yesterday. It was confirmed that the patient would like to be at home and understands his poor clinical conditions.    BP 140/71   Pulse 96   Temp 98.2 F (36.8 C) (Oral)   Resp 16   Ht 6\' 1"  (1.854 m)   Wt 83.4 kg (183 lb 13.8 oz)   SpO2 91%   BMI 24.26 kg/m    REVIEW OF SYSTEMS  GENERAL:-fever/chills, +fatigue HEENT:-headache, -eye pain, -eye discharge -ear pain, -ear discharge, -congestion, -sore throat,  PULMONARY:+shortness of breath, -cough, -sputum production, -hemoptysis, -wheezing CARDIOVASCULAR: -chest pain, -palpitations, -orthopnea, -PND, -edema GASTROINTESTINAL: -abdominal pain, -nausea, -vomiting, -diarrhea, -constipation MUSCULOSKELETAL:-myalgias, -joint pain NEUROLOGIC-dizziness, -lightheadedness, -LOC, -weakness SKIN: -itching, -rash   PHYSICAL EXAMINATION:  GENERAL:critically ill appearing, +resp distress HEAD: Normocephalic, atraumatic.  EYES: Pupils equal, round, reactive to light.  No scleral icterus.  MOUTH: Moist mucosal membrane. NECK: Supple. No thyromegaly. No nodules. No JVD.  PULMONARY: clear to auscultation bilaterally, -wheezes, -rales CARDIOVASCULAR: S1 and S2. Regular rate and rhythm. No murmurs, rubs, or gallops. 2+ dorsalis pedis pulses  bilaterally GASTROINTESTINAL: Soft, nontender, -distended. No masses. Positive bowel sounds. No hepatosplenomegaly.  MUSCULOSKELETAL: No swelling, clubbing, or edema.  NEUROLOGIC: alert and oriented, eating breakfast  SKIN:intact,warm,dry  ASSESSMENT AND PLAN SYNOPSIS  72 year old male with history of hypertension, DM, and CVA who was admitted on 8/1 for acute hypoxic respiratory failure. Patient would like to go home with hospice services and we are working with palliative care to make that happen. Patient removed HFNC yesterday and was started on nasal cannula without incident. This morning patient is on 4L of O2 with a  Sat of 91%.  Patient is status DO NOT RESUSCITATE  Severe Hypoxic and Hypercapnic Respiratory Failure secondary to COPD -continue nasal cannula, HFNC if below 85% -BiPAP discontinued -continue Bronchodilator Therapy -spot check O2 sat performed by respiratory therapy: continue.    Renal Failure- -creatinine 2.08 -patient has indicated he would not like hemodialysis -hyperkalemia resolved: 5.0 -continue to monitor as patient allows lab draws   Endocrinology: -diabetes mellitus -patient with hyperglycemia (455) yesterday evening; reported he had 2 milkshakes and a sweet potato with brown sugar for dinner -46 units of novolog insulin given over 4 hours with lantus 20 units QHS  -patient became hypoglymeic overnight:  53 -appreciate diabetes coordinator input -consider decreasing/discontinuing steroids  -unable to modify patient's diet at this time as moving towards comfort care  CARDIAC ICU monitoring, patient continues to be in NSR -atrial fibrillation: oral carvedilol, amiodarone, amlodipine  ID -continue IV abx as prescribed: ceftriaxone -follow up cultures: no growth x 5 days   DVT: heparin GI PRX: pepcid  TRANSFUSIONS AS NEEDED MONITOR FSBS ASSESS the need for LABS as needed   Critical Care Time devoted to patient care services described in  this  note is 29 minutes.   Overall, patient is critically ill, prognosis is guarded.  Patient with Multiorgan failure and at high risk for cardiac arrest and death.

## 2018-03-14 NOTE — Progress Notes (Signed)
CRITICAL CARE NOTE  CC  follow up respiratory failure secondary to COPD and CHF  SUBJECTIVE Patient remains critically ill Prognosis is guarded  72 year old male admitted on 8/1 for acute hypoxic respiratory failure with COPD and CHF exacerbation. This morning, patient tells me he is feeling terrible because he cannot go home. He endorses continued SOB, but none worse than his previous days. He again states he did not sleep well last night, but he did take a nap during the day yesterday. Patient took HFNC off himself yesterday and was transferred to nasal cannula without problem.    SIGNIFICANT EVENTS Discussion was held between patient, family, palliative care team, and critical care team yesterday. It was confirmed that the patient would like to be at home and understands his poor clinical conditions.    BP (!) 160/69   Pulse 96   Temp 98.2 F (36.8 C) (Oral)   Resp (!) 23   Ht 6\' 1"  (1.854 m)   Wt 183 lb 13.8 oz (83.4 kg)   SpO2 91%   BMI 24.26 kg/m    REVIEW OF SYSTEMS  GENERAL:-fever/chills, +fatigue HEENT:-headache, -eye pain, -eye discharge -ear pain, -ear discharge, -congestion, -sore throat,  PULMONARY:+shortness of breath, -cough, -sputum production, -hemoptysis, -wheezing CARDIOVASCULAR: -chest pain, -palpitations, -orthopnea, -PND, -edema GASTROINTESTINAL: -abdominal pain, -nausea, -vomiting, -diarrhea, -constipation MUSCULOSKELETAL:-myalgias, -joint pain NEUROLOGIC-dizziness, -lightheadedness, -LOC, -weakness SKIN: -itching, -rash   PHYSICAL EXAMINATION:  GENERAL:critically ill appearing, +resp distress HEAD: Normocephalic, atraumatic.  EYES: Pupils equal, round, reactive to light.  No scleral icterus.  MOUTH: Moist mucosal membrane. NECK: Supple. No thyromegaly. No nodules. No JVD.  PULMONARY: clear to auscultation bilaterally, -wheezes, -rales CARDIOVASCULAR: S1 and S2. Regular rate and rhythm. No murmurs, rubs, or gallops. 2+ dorsalis pedis pulses  bilaterally GASTROINTESTINAL: Soft, nontender, -distended. No masses. Positive bowel sounds. No hepatosplenomegaly.  MUSCULOSKELETAL: No swelling, clubbing, or edema.  NEUROLOGIC: alert and oriented, eating breakfast  SKIN:intact,warm,dry  ASSESSMENT AND PLAN SYNOPSIS  72 year old male with history of hypertension, DM, and CVA who was admitted on 8/1 for acute hypoxic respiratory failure. Patient would like to go home with hospice services and we are working with palliative care to make that happen. Patient removed HFNC yesterday and was started on nasal cannula without incident. This morning patient is on 4L of O2 with a  Sat of 91%.  Patient is status DO NOT RESUSCITATE  Severe Hypoxic and Hypercapnic Respiratory Failure secondary to COPD -continue nasal cannula, HFNC if below 85% -BiPAP discontinued -continue Bronchodilator Therapy -spot check O2 sat performed by respiratory therapy: continue.    Renal Failure- -creatinine 2.08 -patient has indicated he would not like hemodialysis -hyperkalemia resolved: 5.0 -continue to monitor as patient allows lab draws   Endocrinology: -diabetes mellitus -patient with hyperglycemia (455) yesterday evening; reported he had 2 milkshakes and a sweet potato with brown sugar for dinner -46 units of novolog insulin given over 4 hours with lantus 20 units QHS  -patient became hypoglymeic overnight:  53 -appreciate diabetes coordinator input -consider decreasing/discontinuing steroids  -unable to modify patient's diet at this time as moving towards comfort care  CARDIAC ICU monitoring, patient continues to be in NSR -atrial fibrillation: oral carvedilol, amiodarone, amlodipine  ID -continue IV abx as prescribed: ceftriaxone -follow up cultures: no growth x 5 days   DVT: heparin GI PRX: pepcid  TRANSFUSIONS AS NEEDED MONITOR FSBS ASSESS the need for LABS as needed    Patient/Family are satisfied with Plan of action and management.  All questions answered  Lucie LeatherKurian David Quantisha Marsicano, M.D.  [facility]  Medical Director [facility] Medical Director [facility] Cardio-Pulmonary Department

## 2018-03-27 LAB — BLOOD GAS, VENOUS
ACID-BASE DEFICIT: 13.4 mmol/L — AB (ref 0.0–2.0)
BICARBONATE: 16.4 mmol/L — AB (ref 20.0–28.0)
O2 Saturation: 35.4 %
Patient temperature: 37
pCO2, Ven: 54 mmHg (ref 44.0–60.0)

## 2018-05-03 ENCOUNTER — Telehealth: Payer: Self-pay | Admitting: Internal Medicine

## 2018-05-03 NOTE — Telephone Encounter (Signed)
Spoke to patient's wife again about the change she is needing. She was unable to get in touch with Dr. Lonn Georgia and didn't know what other route to take. Reiterated to her we are unable to change anything, she understands. Suggested again she contact Dr. Laural Benes to see if he might have any more answers/suggestions for her. She understands. No further questions at this time.

## 2018-05-03 NOTE — Telephone Encounter (Signed)
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  She is asking instead of it saying just CHF and COPD if we can please write that patient had ischemic heart disease  She states the [place] told her that she would receive the benefits right away if that was written   She is also asking for advise.  Once we change this she is needing to know how would she get the death certificate amended  She is not sure but is very worried and sadden for her spouse only did all this for her and she's not able to handle it on her own.   Please advise

## 2018-05-03 NOTE — Telephone Encounter (Signed)
I had a lengthy conversation with patient's wife. I notified her since we were not the one to sign the death certificate, there is absolutely no way we would amend it. I suggested she call [facility] and see if they have perhaps seen this happen before. I also suggested she call Dr. Lutricia Horsfall office and perhaps he might have an answer.  Wife was very thankful for advise, but still distraught in trying to figure this all out. No further questions at this time.

## 2018-05-03 NOTE — Telephone Encounter (Signed)
Patient calling back to talk to christine about Dr. Lonn Georgia getting in touch with him.Joseph Little

## 2018-05-08 DEATH — deceased

## 2018-12-31 IMAGING — US US RENAL
1 series · 14 of 25 positions shown · non-contrast
Comparison: None.

CLINICAL DATA: Acute renal failure.

EXAM:
RENAL / URINARY TRACT ULTRASOUND COMPLETE

[Series 1: us renal · 0.28mm/px · 14 of 30 slices shown]
[im 1/30]
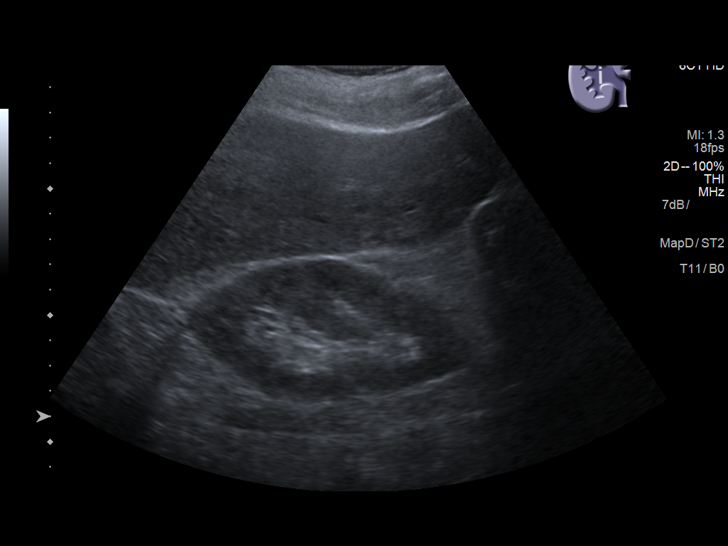
[im 3/30]
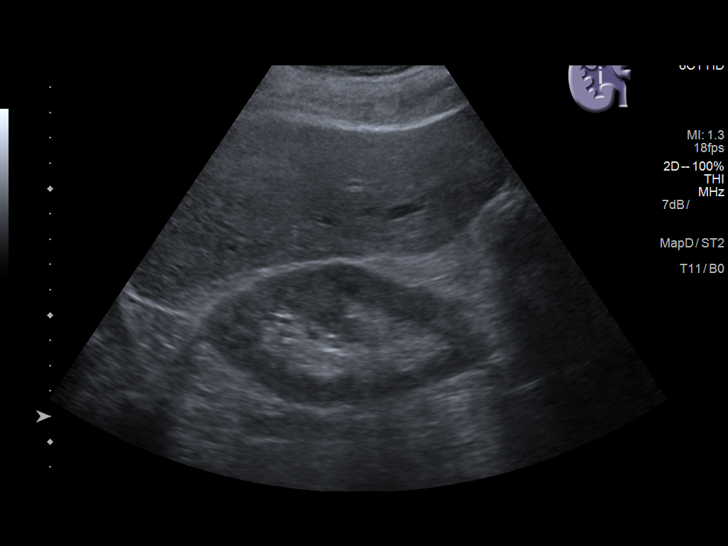
[im 5/30]
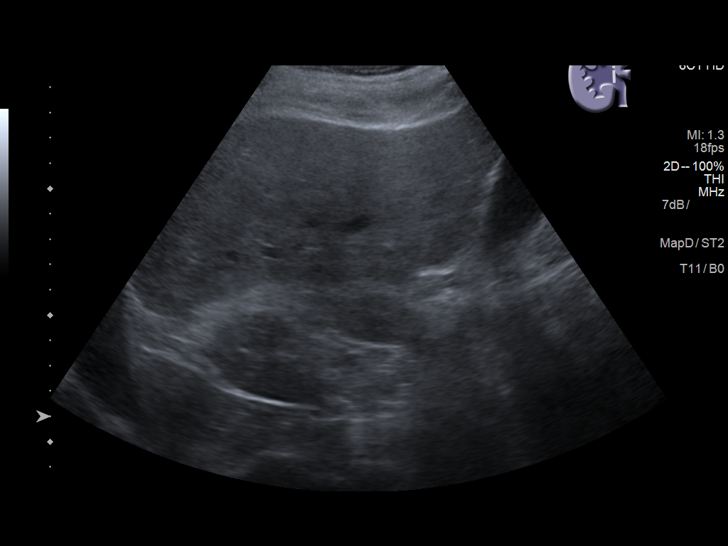
[im 8/30]
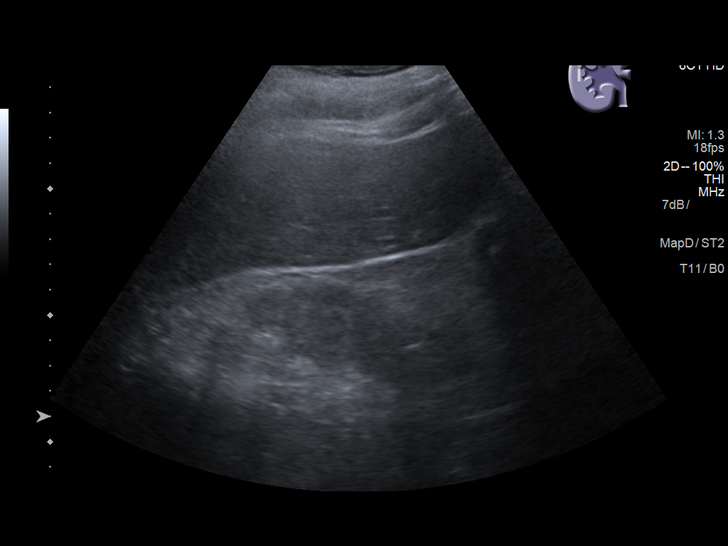
[im 10/30]
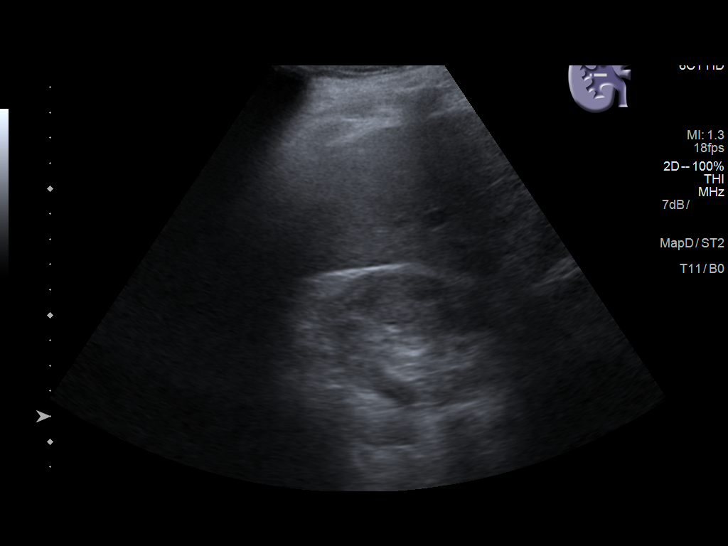
[im 11/30]
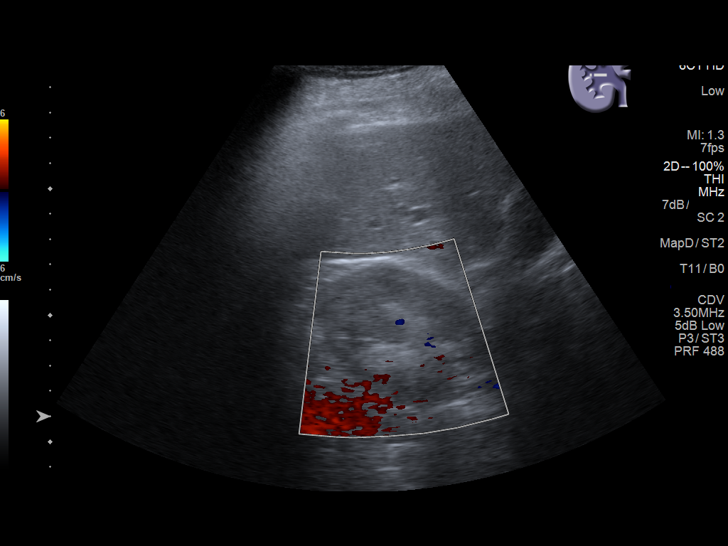
[im 14/30]
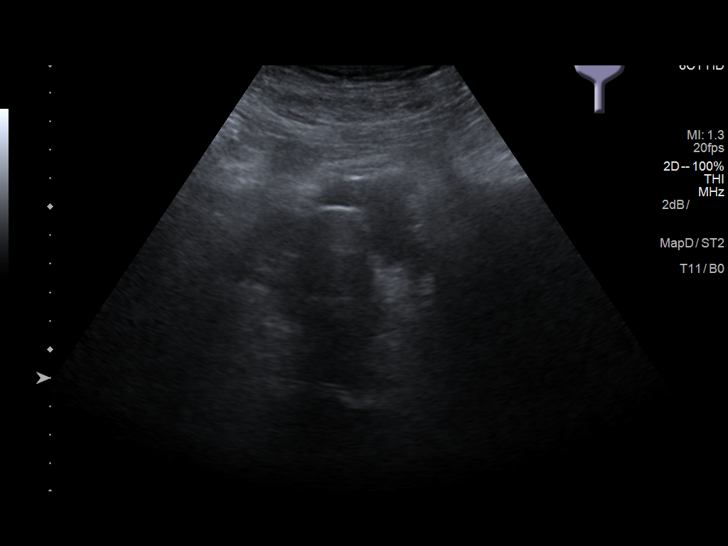
[im 16/30]
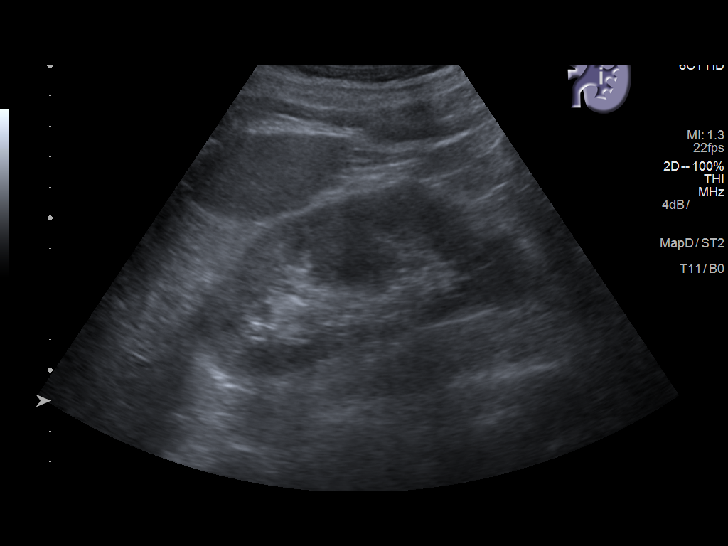
[im 19/30]
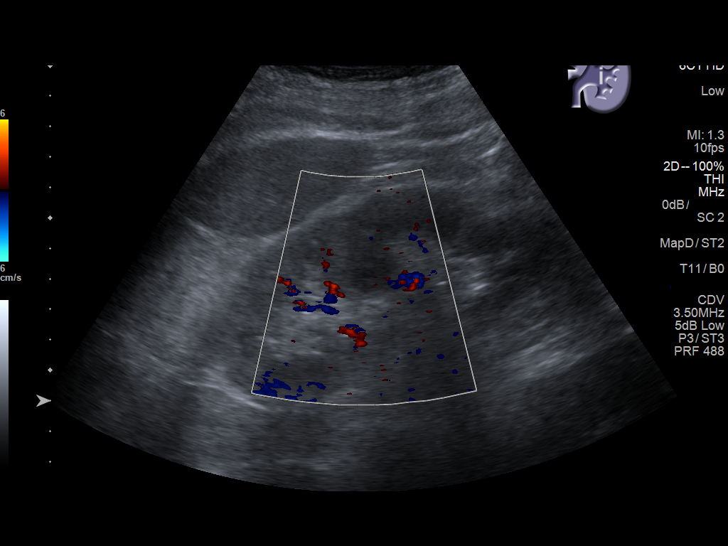
[im 20/30]
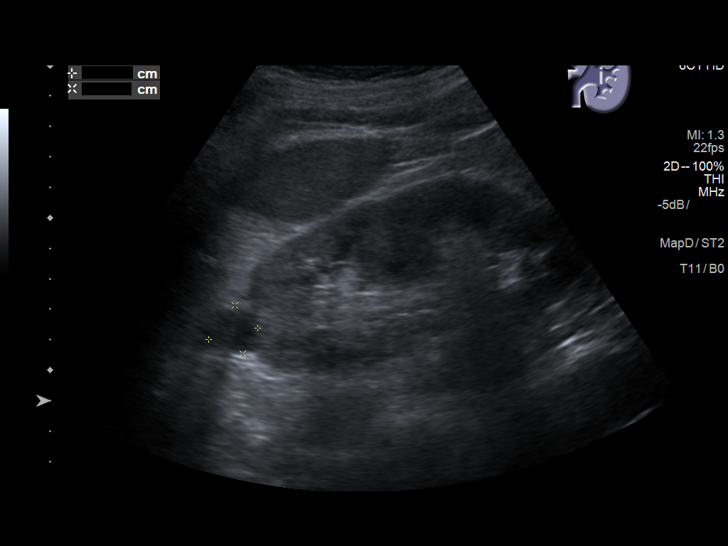
[im 22/30]
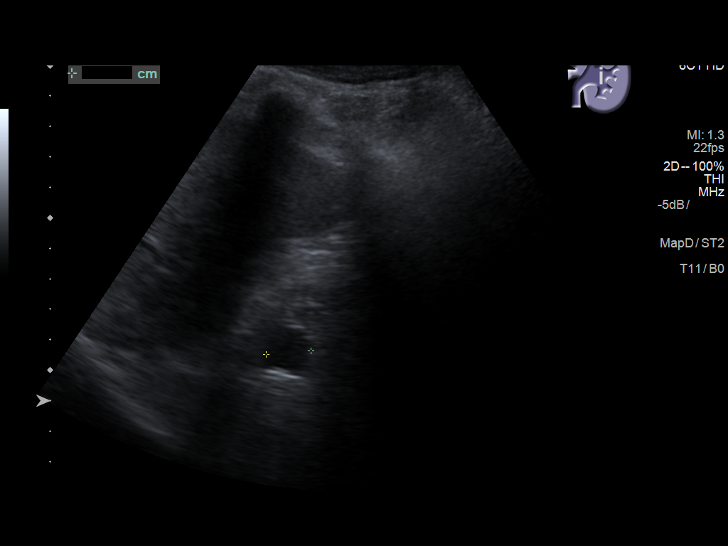
[im 25/30]
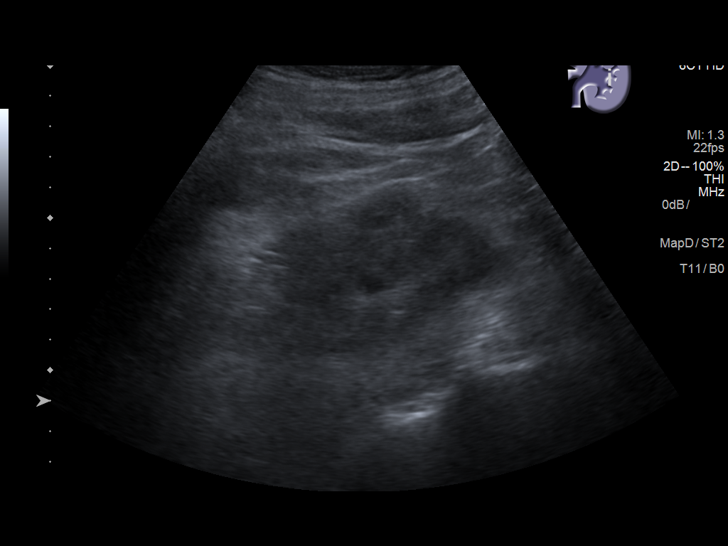
[im 27/30]
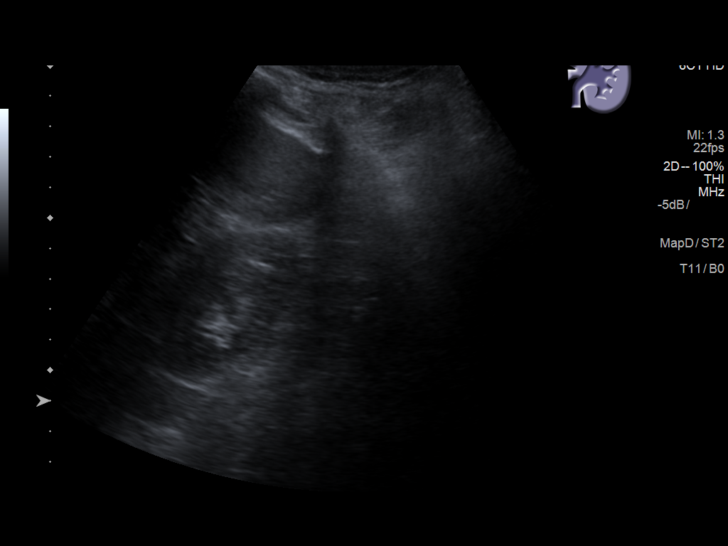
[im 30/30]
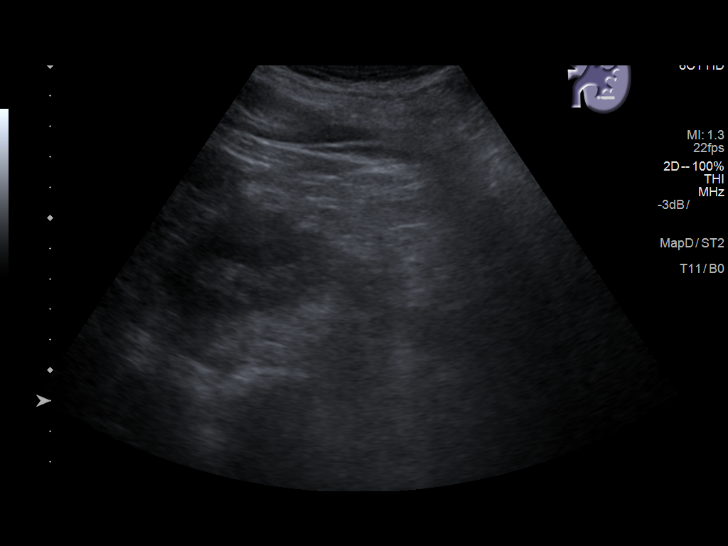

[14 of 25 positions shown; findings below may reference images not displayed]

FINDINGS: Right Kidney:

Length: 11.5 cm. Echogenicity within normal limits. No mass or
hydronephrosis visualized.

Left Kidney:

Length: 11.4 cm. 1.6 cm exophytic simple cyst is seen arising from
upper pole. Echogenicity within normal limits. No mass or
hydronephrosis visualized.

Bladder:

Decompressed secondary to Foley catheter.
IMPRESSION: No significant renal abnormality seen.

## 2019-07-21 IMAGING — CT CT HEAD W/O CM
3 series · 14 of 47 positions shown, 16 images · non-contrast
Comparison: None.

ADDENDUM:
Study discussed by telephone with Dr. DOONGOOR SIMOES on 10/07/2017 at
9110 hours.

She advises that symptom onset was approximately 4 days ago, which
seems concordant with the appearance of the left hemisphere
infarcts.
CLINICAL DATA: 71-year-old male with unexplained altered mental
status.
EXAM:
CT HEAD WITHOUT CONTRAST
TECHNIQUE: Contiguous axial images were obtained from the base of the skull
through the vertex without intravenous contrast.

[Series 2: head wo · axial · 0.47mm/px · z∈[-109,+16]mm · 8 of 30 slices shown, 10 images]
[im 3/30  brain]
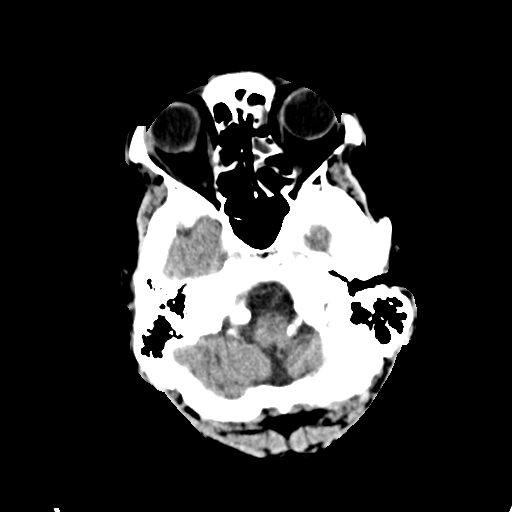
[im 3/30  bone]
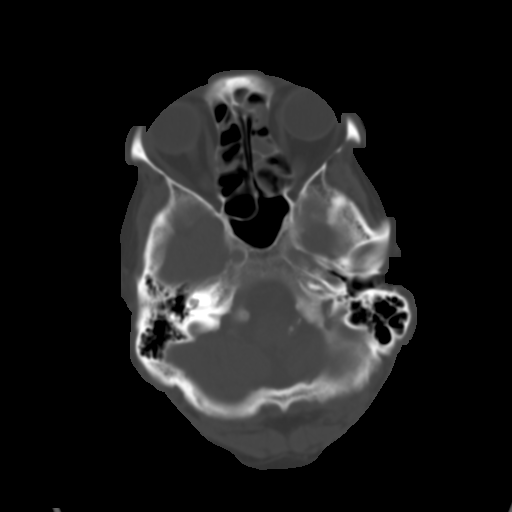
[im 7/30  brain]
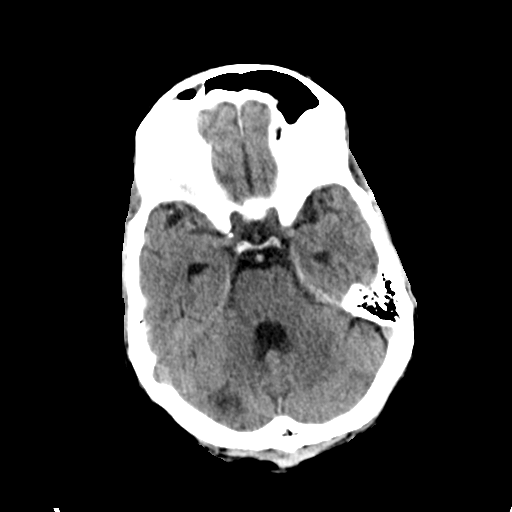
[im 10/30  brain]
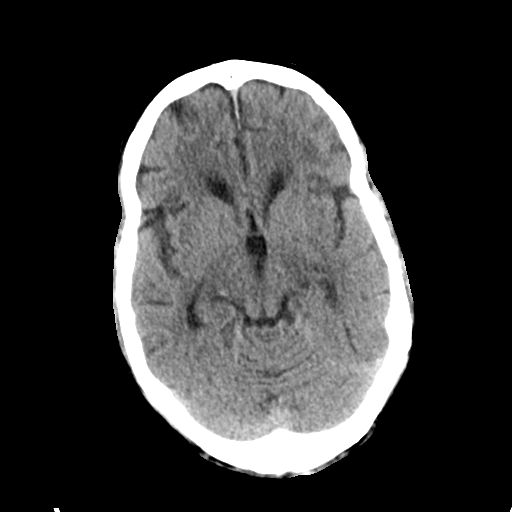
[im 14/30  brain]
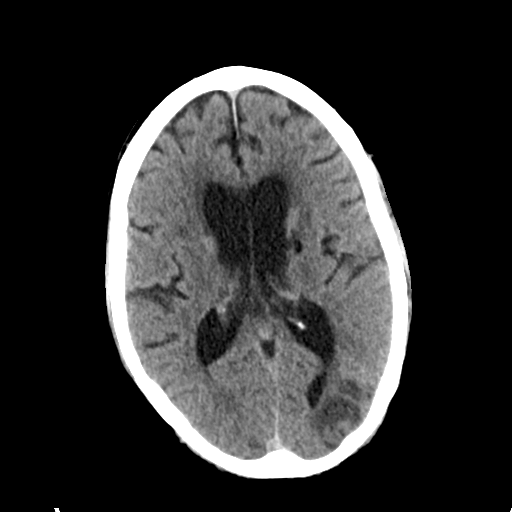
[im 17/30  brain]
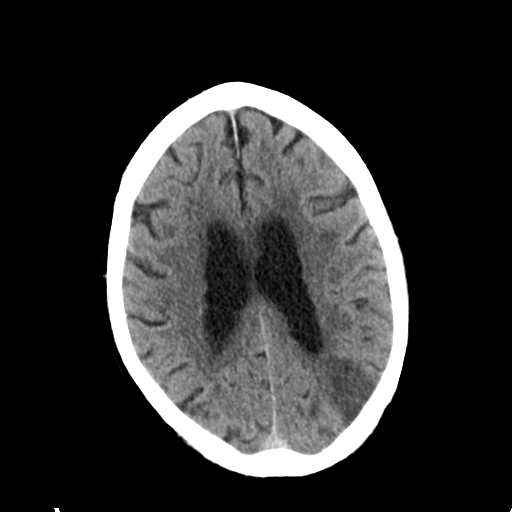
[im 17/30  bone]
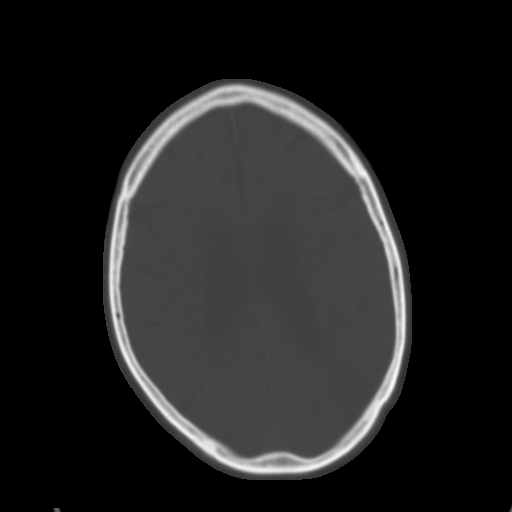
[im 21/30  brain]
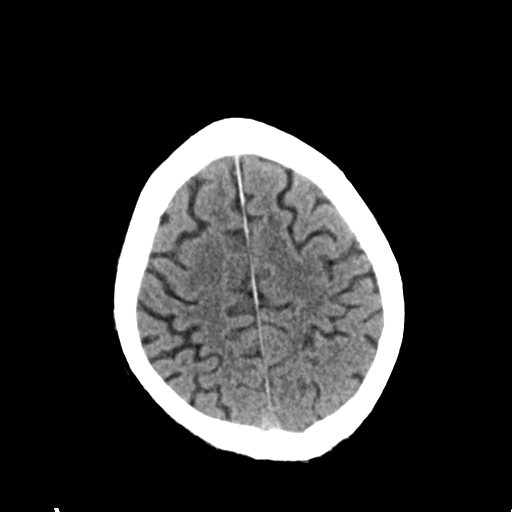
[im 24/30  brain]
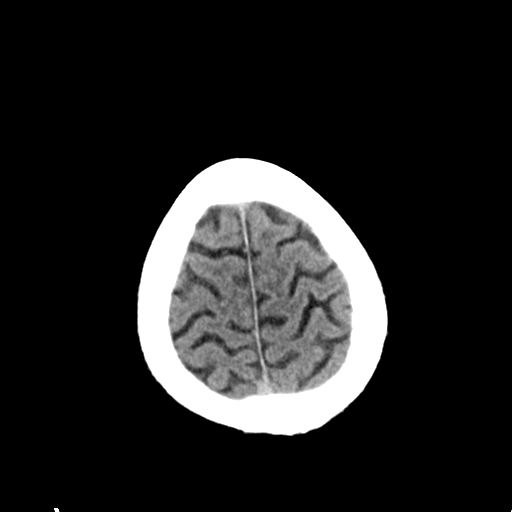
[im 28/30  brain]
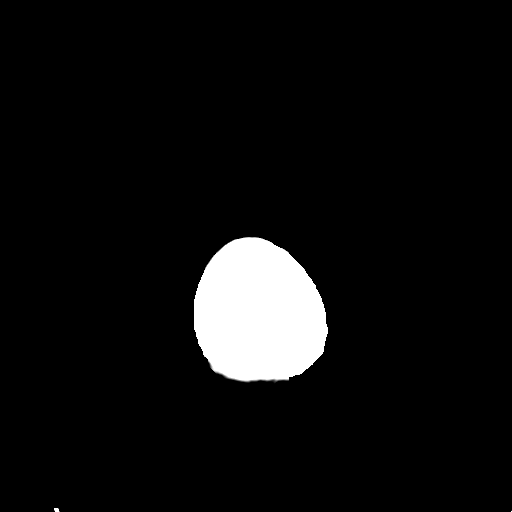

[Series 4: coronal soft tissue · coronal · 0.30mm/px · 3 of 69 slices shown]
[im 23/69  brain]
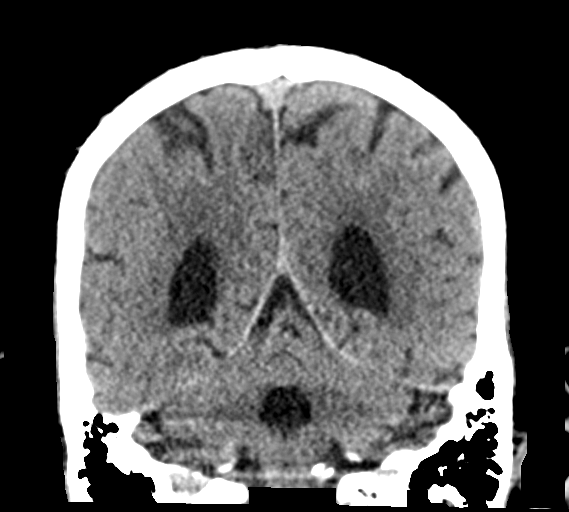
[im 31/69  brain]
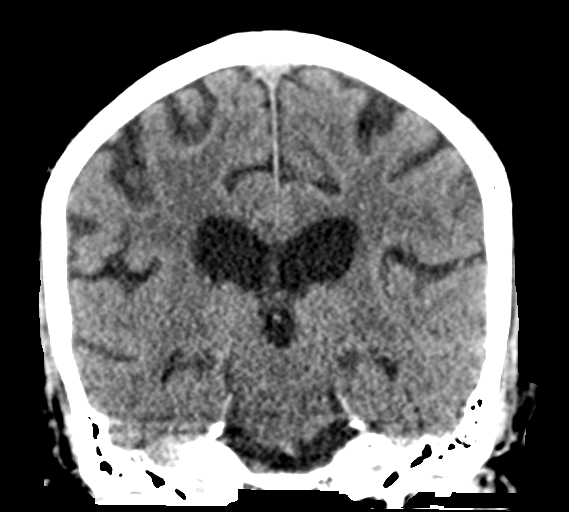
[im 38/69  brain]
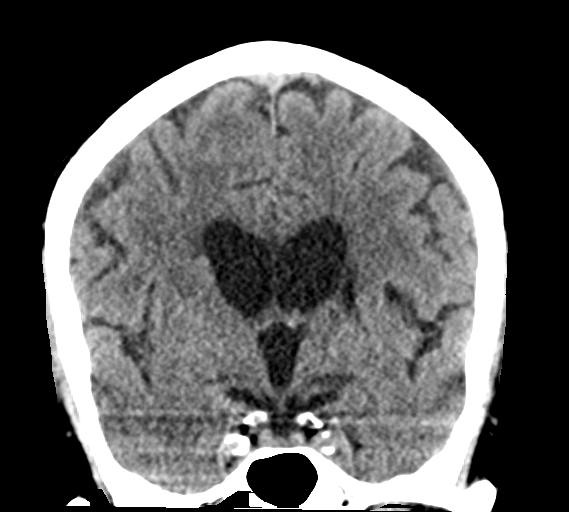

[Series 5: sagittal soft tissue · sagittal · 0.30mm/px · 3 of 54 slices shown]
[im 18/54  brain]
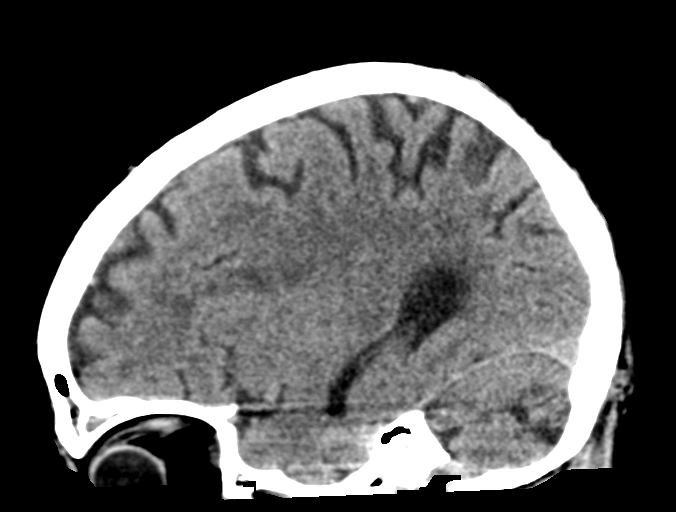
[im 27/54  brain]
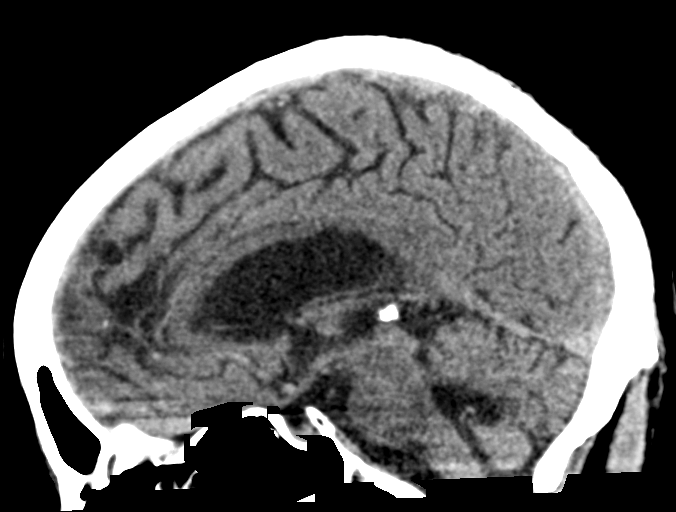
[im 36/54  brain]
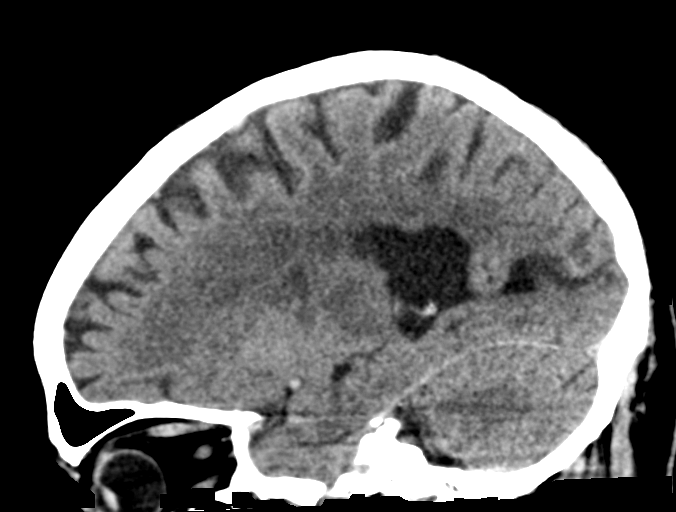

[14 of 47 positions shown; findings below may reference images not displayed]

FINDINGS: Brain: Confluent 4 centimeter area of cytotoxic edema in the
inferior left parietal and superior left occipital lobes. Possible
petechial hemorrhage (series 2, image 15), but no associated mass
effect. The left occipital horn is actually slightly larger than the
right. There is also a small area of cortical hypodensity in the
superior left parietal lobe on series 2, image 22. There is also a
small area of cortical hypodensity in the left frontal operculum on
image 18.

Chronic appearing lacunar infarcts in the left basal ganglia. Small
age indeterminate lacune in the anterior left thalamus (series 2,
image 12).

Patchy chronic appearing hypodensity in the right cerebellar
hemisphere. The right aspect of the 4th ventricle is slightly larger
also suggesting chronic time course. There is also a tiny chronic
appearing lacune in the left superior cerebellum on image 9.

Scattered bilateral cerebral white matter hypodensity, mostly
subcortical. No other cortical based infarct identified. No other
intracranial hemorrhage. No intracranial mass effect. No
ventriculomegaly. No extra-axial fluid collection.

Vascular: Calcified atherosclerosis at the skull base. No definite
suspicious intracranial vascular hyperdensity.

Skull: Negative.

Sinuses/Orbits: Scattered paranasal sinus mucosal thickening and
opacification. The tympanic cavities and mastoids appear clear.

Other: No acute orbit or scalp soft tissue findings.
IMPRESSION: 1. Scattered acute versus subacute appearing infarcts in the left
hemisphere, largest at the left posterior MCA/PCA watershed area.
There is an associated age indeterminate lacune in the left
thalamus. Possible petechial hemorrhage, but no malignant
hemorrhagic transformation or mass effect.
2. Age indeterminate but probably chronic right cerebellar infarct.
Tiny chronic left cerebellar lacune. Small chronic appearing lacunar
infarcts in the left basal ganglia.
